# Patient Record
Sex: Female | Born: 2000 | Race: White | Hispanic: No | Marital: Single | Smoking: Current every day smoker
Health system: Southern US, Community
[De-identification: ages and names within clinical notes are randomized; demographics above are authoritative.]

## PROBLEM LIST (undated history)

## (undated) DIAGNOSIS — F32A Depression, unspecified: Secondary | ICD-10-CM

## (undated) DIAGNOSIS — N390 Urinary tract infection, site not specified: Secondary | ICD-10-CM

## (undated) DIAGNOSIS — F909 Attention-deficit hyperactivity disorder, unspecified type: Secondary | ICD-10-CM

## (undated) DIAGNOSIS — F419 Anxiety disorder, unspecified: Secondary | ICD-10-CM

## (undated) DIAGNOSIS — F329 Major depressive disorder, single episode, unspecified: Secondary | ICD-10-CM

## (undated) DIAGNOSIS — N289 Disorder of kidney and ureter, unspecified: Secondary | ICD-10-CM

## (undated) HISTORY — DX: Urinary tract infection, site not specified: N39.0

## (undated) HISTORY — DX: Anxiety disorder, unspecified: F41.9

## (undated) HISTORY — DX: Depression, unspecified: F32.A

## (undated) HISTORY — PX: KIDNEY SURGERY: SHX687

---
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
## 2019-04-10 LAB — CBC AND DIFFERENTIAL
HCT: 40 (ref 36–46)
Hemoglobin: 13 (ref 12.0–16.0)
Platelets: 200 (ref 150–399)
WBC: 5.4

## 2019-04-10 LAB — VITAMIN B12: Vitamin B-12: 254

## 2019-04-10 LAB — BASIC METABOLIC PANEL
BUN: 12 (ref 4–21)
Creatinine: 1.2 — AB (ref 0.5–1.1)
Glucose: 95
Potassium: 3.7 (ref 3.4–5.3)
Sodium: 138 (ref 137–147)

## 2019-04-10 LAB — HEPATIC FUNCTION PANEL
ALT: 8 (ref 7–35)
AST: 18 (ref 13–35)
Alkaline Phosphatase: 41 (ref 25–125)
Bilirubin, Total: 1.4

## 2019-04-10 LAB — VITAMIN D 25 HYDROXY (VIT D DEFICIENCY, FRACTURES): Vit D, 25-Hydroxy: 36.86

## 2019-04-10 LAB — TSH: TSH: 0.73 (ref 0.41–5.90)

## 2019-05-04 ENCOUNTER — Telehealth: Payer: Self-pay | Admitting: General Practice

## 2019-05-04 NOTE — Telephone Encounter (Signed)
Patients stepfather requesting a call back from [facility].

## 2019-05-04 NOTE — Telephone Encounter (Signed)
Called to speak with pt's father. Patient's father was asking about an urgent referral to a psychiatrist due to pts suicidal thoughts and depression. Pt is scheduled for a NP appt with Sam on Tuesday, but I wanted to let the father know that they could take the pt to [facility] and let them know that she is having suicidal thoughts and they will check her in, do some lab work, the ED doctor will talk with her and they will have her change into scrubs and put her belongings away. Then she will speak with a psychiatrist and they will decide to either keep her for behavioral health inpatient or send her home, but based on the description of her symptoms given by her father, they will most likely keep her for inpatient.  Called father to let him know this information, no answer, and left a brief message asking him to call the office back for the information.

## 2019-05-04 NOTE — Telephone Encounter (Signed)
See note

## 2019-05-04 NOTE — Telephone Encounter (Signed)
I wanted to explain a little bit about this appt. I have been communicating with her stepfather, Sam. They are very concerned about her having suicidal thoughts. I did let him know that taking her to [facility] was an option if they werent able to wait until Tuesday for the appt but they did not want to do that. I am happy to fill you in in-person, but basically here are the highlights:  -I scheduled the new patient appt as virtual because I know that is your and Sam's preference, but the patient would rather come into the office if that is OK with you. I also scheduled the appt for 60 minutes due to everything going on with the pt. The patient's stepfather is a patient of Sam's and they specifically requested her, I let them know she is not able to prescribe ADD/ADHD medication.    -The pt and her stepfather are requesting an urgent referral to a female psychiatrist and they really want to be able to see the psychiatrist in person, not through virtual visits, but I told them I wasn't sure if that would be an option.   -I included in the appt notes that the pt possibly has some nutritional issues/hormone imbalance. The pt's stepfather said that she is weighing only 90 lbs but does not think we should mention to the pt that he thinks there may be some nutritional issues.   - There was so much going on with this pt that I wanted to kind of run everything by you. I am happy to discuss all of this in-person when you and Sam are back in the office, but I honestly think it would be best if you could call the stepfather at [phone] (listed as her home phone in her chart) so he can fill you in before her appt on Tuesday.

## 2019-05-10 ENCOUNTER — Ambulatory Visit (INDEPENDENT_AMBULATORY_CARE_PROVIDER_SITE_OTHER): Payer: Medicaid Other | Admitting: Physician Assistant

## 2019-05-10 ENCOUNTER — Encounter: Payer: Self-pay | Admitting: Physician Assistant

## 2019-05-10 VITALS — Ht 62.0 in | Wt 85.0 lb

## 2019-05-10 DIAGNOSIS — R636 Underweight: Secondary | ICD-10-CM

## 2019-05-10 DIAGNOSIS — F419 Anxiety disorder, unspecified: Secondary | ICD-10-CM | POA: Diagnosis not present

## 2019-05-10 DIAGNOSIS — F322 Major depressive disorder, single episode, severe without psychotic features: Secondary | ICD-10-CM | POA: Diagnosis not present

## 2019-05-10 NOTE — Progress Notes (Signed)
Virtual Visit via Video   I connected with Alice Armstrong on 05/10/19 at 11:00 AM EDT by a video enabled telemedicine application and verified that I am speaking with the correct person using two identifiers. Location patient: Home Location provider: [facility], Office Persons participating in the virtual visit: Natsuko D Oliviya, [place] PA-C  I discussed the limitations of evaluation and management by telemedicine and the availability of in person appointments. The patient expressed understanding and agreed to proceed.  I acted as a Education administrator for [employer], [employer], LPN  Subjective:   HPI:   Anxiety & Depression Pt c/o increased anxiety and depression for the past year. Hx of anxiety and depression x 4 years.  Pt stopped OC's cause she thought is was causing depression. Pt is also having panic attacks once a month. She has seen multiple providers in the past, most recently was seeing Primary Care at [facility] and prior to that [employer] at [facility] in [place]. I only have [facility] notes to review at this time. Per cursory chart review she has been on the following medications in the past: Propranolol, Clonidine, Zoloft, Wellbutrin, Ativan. Was seeing Noemi Chapel, psych NP at [facility] -- last seen a few years ago. Mom sees her as well and she would like a new provider.   She also has a diagnosis of ADD. She was diagnosed in middle school, but couldn't tolerate medications due to decreased appetite and feeling "off."  She currently denies suicidal plans. No prior attempts.  Has a boyfriend x 1 year, and trusts him very much. He is aware of her current mental health status and he "knows more than anyone else in my life." She has concerns for bipolar disorder, states that she has never been tested.  Underweight Has had 30 lb of unintentional weight loss x 3 years. Was on birth control pills and was able to maintain weight  around 115 lb. After she stopped the birth control she began to lose weight. Would like to restart. She has not had a period in 1 year.  Dietary recall: Breakfast -- breakfast essentials or cereal Snacks -- almonds or chips Dinner - grilled chicken breast, veggies Snacks -- variable, fruits and chips  Beverages: water, sweet tea  Alcohol: once a month (approximately 1 glass)  Was trying Ensure supplements. Swims twice a week, no excessive exercise. Goes on occasional walks. Denies any fear of weight gain. Wants to get back to 115 lb, feels more comfortable there. Denies purging, bingeing, laxative use.  Gets lightheaded when standing. Gets palpitations on a daily basis. Has a bowel movement every day. Has a 16 oz bottle of water x 2 daily.  Wt Readings from Last 5 Encounters:  05/10/19 85 lb (38.6 kg)  01/11/16 115 lb (52.2 kg) (37 %, Z= -0.34)*  12/10/15 114 lb 6.7 oz (51.9 kg) (36 %, Z= -0.36)*   * Growth percentiles are based on CDC (Girls, 2-20 Years) data.   Body mass index is 15.55 kg/m.    ROS: See pertinent positives and negatives per HPI.  There are no active problems to display for this patient.   Social History   Tobacco Use  . Smoking status: Never Smoker  . Smokeless tobacco: Never Used  Substance Use Topics  . Alcohol use: Yes    Comment: once a month one drink    Current Outpatient Medications:  .  cetirizine (ZYRTEC) 10 MG tablet, Take 10 mg  by mouth daily as needed. , Disp: , Rfl:  .  ibuprofen (ADVIL,MOTRIN) 200 MG tablet, Take 200 mg by mouth every 6 (six) hours as needed. pain, Disp: , Rfl:  .  LORazepam (ATIVAN) 0.5 MG tablet, Take 0.5 mg by mouth as needed. , Disp: , Rfl:   Allergies  Allergen Reactions  . Sulfa Antibiotics Swelling    potential  . Cephalosporins Hives and Swelling  . Latex Other (See Comments)    blisters  . Penicillins Hives and Swelling  . Tape Rash    Objective:   VITALS: Per patient if applicable, see vitals.  GENERAL: Alert, appears well and in no acute distress. HEENT: Atraumatic, conjunctiva clear, no obvious abnormalities on inspection of external nose and ears. NECK: Normal movements of the head and neck. CARDIOPULMONARY: No increased WOB. Speaking in clear sentences. I:E ratio WNL.  MS: Moves all visible extremities without noticeable abnormality. PSYCH: Pleasant and cooperative, well-groomed. Speech normal rate and rhythm. Affect is appropriate. Insight and judgement are appropriate. Attention is focused, linear, and appropriate.  NEURO: CN grossly intact. Oriented as arrived to appointment on time with no prompting. Moves both UE equally.  SKIN: No obvious lesions, wounds, erythema, or cyanosis noted on face or hands.  Assessment and Plan:   Alice Armstrong was seen today for establish care, depression and anxiety.  Diagnoses and all orders for this visit:  Underweight Concern for malnutrition given low BMI and suspect inadequate cal/pro intake. We made her an appointment to come into the office for physical exam, EKG and labs. Will assess severity and determine best course of action at that time (inpatient vs outpatient mgmt.) -     CBC with Differential/Platelet; Future -     Phosphorus; Future -     Magnesium; Future -     Basic metabolic panel; Future -     Hepatic function panel; Future -     TSH; Future -     Lipid panel; Future -     POCT urine pregnancy; Future  Depression, major, single episode, severe (HCC); Anxiety Denies current SI/HI. Has good support system with boyfriend. I discussed with patient that if they develop any SI, to tell someone immediately and seek medical attention. Psych referral placed. -     Ambulatory referral to Psychiatry  . Reviewed expectations re: course of current medical issues. . Discussed self-management of symptoms. . Outlined signs and symptoms indicating need for more acute intervention. . Patient verbalized understanding and all questions  were answered. Marland Kitchen. Health Maintenance issues including appropriate healthy diet, exercise, and smoking avoidance were discussed with patient. . See orders for this visit as documented in the electronic medical record.  I discussed the assessment and treatment plan with the patient. The patient was provided an opportunity to ask questions and all were answered. The patient agreed with the plan and demonstrated an understanding of the instructions.   The patient was advised to call back or seek an in-person evaluation if the symptoms worsen or if the condition fails to improve as anticipated.   CMA or LPN served as scribe during this visit. History, Physical, and Plan performed by medical provider. The above documentation has been reviewed and is accurate and complete.  I spent 45 minutes with this patient, greater than 50% was face-to-face time counseling regarding the above diagnoses.  WoxallSamantha Winfred Redel, [place]PA 05/10/2019

## 2019-05-11 ENCOUNTER — Other Ambulatory Visit: Payer: Self-pay

## 2019-05-11 ENCOUNTER — Encounter: Payer: Self-pay | Admitting: Physician Assistant

## 2019-05-11 ENCOUNTER — Ambulatory Visit (INDEPENDENT_AMBULATORY_CARE_PROVIDER_SITE_OTHER): Payer: Medicaid Other | Admitting: Physician Assistant

## 2019-05-11 VITALS — BP 118/70 | HR 73 | Temp 98.3°F | Ht 62.0 in | Wt 87.0 lb

## 2019-05-11 DIAGNOSIS — R636 Underweight: Secondary | ICD-10-CM | POA: Diagnosis not present

## 2019-05-11 DIAGNOSIS — R5383 Other fatigue: Secondary | ICD-10-CM | POA: Diagnosis not present

## 2019-05-11 LAB — CBC WITH DIFFERENTIAL/PLATELET
Basophils Absolute: 0 10*3/uL (ref 0.0–0.1)
Basophils Relative: 0.6 % (ref 0.0–3.0)
Eosinophils Absolute: 0 10*3/uL (ref 0.0–0.7)
Eosinophils Relative: 0.7 % (ref 0.0–5.0)
HCT: 39 % (ref 36.0–46.0)
Hemoglobin: 13.2 g/dL (ref 12.0–15.0)
Lymphocytes Relative: 40 % (ref 12.0–46.0)
Lymphs Abs: 2.9 10*3/uL (ref 0.7–4.0)
MCHC: 33.8 g/dL (ref 30.0–36.0)
MCV: 92.1 fl (ref 78.0–100.0)
Monocytes Absolute: 0.5 10*3/uL (ref 0.1–1.0)
Monocytes Relative: 7.2 % (ref 3.0–12.0)
Neutro Abs: 3.8 10*3/uL (ref 1.4–7.7)
Neutrophils Relative %: 51.5 % (ref 43.0–77.0)
Platelets: 218 10*3/uL (ref 150.0–400.0)
RBC: 4.24 Mil/uL (ref 3.87–5.11)
RDW: 12.6 % (ref 11.5–14.6)
WBC: 7.4 10*3/uL (ref 4.5–10.5)

## 2019-05-11 LAB — BASIC METABOLIC PANEL
BUN: 14 mg/dL (ref 6–23)
CO2: 27 mEq/L (ref 19–32)
Calcium: 10.4 mg/dL (ref 8.4–10.5)
Chloride: 101 mEq/L (ref 96–112)
Creatinine, Ser: 0.97 mg/dL (ref 0.40–1.20)
GFR: 73.07 mL/min (ref >60.00)
Glucose, Bld: 80 mg/dL (ref 70–99)
Potassium: 4 mEq/L (ref 3.5–5.1)
Sodium: 138 mEq/L (ref 135–145)

## 2019-05-11 LAB — PHOSPHORUS: Phosphorus: 3.9 mg/dL (ref 2.3–4.6)

## 2019-05-11 LAB — HEPATIC FUNCTION PANEL
ALT: 8 U/L (ref 0–35)
AST: 18 U/L (ref 0–37)
Albumin: 5.3 g/dL — ABNORMAL HIGH (ref 3.5–5.2)
Alkaline Phosphatase: 47 U/L (ref 39–117)
Bilirubin, Direct: 0.2 mg/dL (ref 0.0–0.3)
Total Bilirubin: 1 mg/dL (ref 0.2–1.2)
Total Protein: 7.9 g/dL (ref 6.0–8.3)

## 2019-05-11 LAB — LIPID PANEL
Cholesterol: 143 mg/dL (ref 0–200)
HDL: 62.9 mg/dL (ref >39.00)
LDL Cholesterol: 68 mg/dL (ref 0–99)
NonHDL: 79.91
Total CHOL/HDL Ratio: 2
Triglycerides: 61 mg/dL (ref 0.0–149.0)
VLDL: 12.2 mg/dL (ref 0.0–40.0)

## 2019-05-11 LAB — POCT URINE PREGNANCY: Preg Test, Ur: NEGATIVE

## 2019-05-11 LAB — MAGNESIUM: Magnesium: 2.1 mg/dL (ref 1.5–2.5)

## 2019-05-11 LAB — TSH: TSH: 0.76 u[IU]/mL (ref 0.35–5.50)

## 2019-05-11 NOTE — Progress Notes (Signed)
Alice Armstrong is a 18 y.o. female is here to follow up on underweight.  I acted as a Neurosurgeonscribe for Energy East CorporationSamantha Eddis Pingleton, PA-C Corky Mullonna Orphanos, LPN  History of Present Illness:   Chief Complaint  Patient presents with  . underweight    HPI  Patient was seen by me yesterday virtually.  She is here today for physical exam, EKG, and labs.  We talked extensively yesterday about her underweight status, possibly resuming birth control, and her anxiety and depression.  Please see encounter from 05/10/2019 for more information on this.   Health Maintenance Due  Topic Date Due  . HIV Screening  02/27/2014  . CHLAMYDIA SCREENING  12/09/2016  . INFLUENZA VACCINE  04/23/2019    Past Medical History:  Diagnosis Date  . ADHD (attention deficit hyperactivity disorder)   . Anxiety   . Depression   . Migraine   . 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 UTI (urinary tract infection)    can sometimes get chronic UTIs for "months"     Social History   Socioeconomic History  . Marital status: Single    Spouse name: Not on file  . Number of children: Not on file  . Years of education: Not on file  . Highest education level: Not on file  Occupational History  . Occupation: Life guard  Social Needs  . Financial resource strain: Not on file  . Food insecurity    Worry: Not on file    Inability: Not on file  . Transportation needs    Medical: Not on file    Non-medical: Not on file  Tobacco Use  . Smoking status: Never Smoker  . Smokeless tobacco: Never Used  Substance and Sexual Activity  . Alcohol use: Yes    Comment: once a month one drink  . Drug use: Not Currently    Types: Marijuana  . Sexual activity: Yes    Birth control/protection: Condom  Lifestyle  . Physical activity    Days per week: Not on file    Minutes per session: Not on file  . Stress: Not on file  Relationships  . Social Musicianconnections    Talks  on phone: Not on file    Gets together: Not on file    Attends religious service: Not on file    Active member of club or organization: Not on file    Attends meetings of clubs or organizations: Not on file    Relationship status: Not on file  . Intimate partner violence    Fear of current or ex partner: Not on file    Emotionally abused: Not on file    Physically abused: Not on file    Forced sexual activity: Not on file  Other Topics Concern  . 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 KIDNEY SURGERY  at 1043 weeks old  . KIDNEY SURGERY      Family History  Problem Relation Age of Onset  . Hypertension Mother   . Anxiety disorder Mother   . Depression Mother   . Thyroid disease Mother   . Hypertension Father   . Heart attack Father   . Colon cancer  Neg Hx     PMHx, SurgHx, SocialHx, FamHx, Medications, and Allergies were reviewed in the Visit Navigator and updated as appropriate.   Patient Active Problem List   Diagnosis Date Noted  . Mild single current episode of major depressive disorder (HCC) 11/06/2016  . Attention deficit disorder 11/06/2016  . Anxiety, generalized 11/06/2016  . Irregular periods 07/31/2015    Social History   Tobacco Use  . Smoking status: Never Smoker  . Smokeless tobacco: Never Used  Substance Use Topics  . Alcohol use: Yes    Comment: once a month one drink  . Drug use: Not Currently    Types: Marijuana    Current Medications and Allergies:    Current Outpatient Medications:  .  cetirizine (ZYRTEC) 10 MG tablet, Take 10 mg by mouth daily as needed. , Disp: , Rfl:  .  ibuprofen (ADVIL,MOTRIN) 200 MG tablet, Take 200 mg by mouth every 6 (six) hours as needed. pain, Disp: , Rfl:  .  LORazepam (ATIVAN) 0.5 MG tablet, Take 0.5 mg by mouth as needed. , Disp: ,  Rfl:    Allergies  Allergen Reactions  . Sulfa Antibiotics Swelling    potential  . Cephalosporins Hives and Swelling  . Latex Other (See Comments)    blisters  . Penicillins Hives and Swelling  . Tape Rash    Review of Systems   ROS  Negative unless otherwise specified per HPI.  Vitals:   Vitals:   05/11/19 1339  BP: 118/70  Pulse: 73  Temp: 98.3 F (36.8 C)  TempSrc: Temporal  SpO2: 99%  Weight: 87 lb (39.5 kg)  Height: 5\' 2"  (1.575 m)     Body mass index is 15.91 kg/m.   Physical Exam:    Physical Exam Vitals signs and nursing note reviewed.  Constitutional:      General: She is not in acute distress.    Appearance: She is well-developed. She is not ill-appearing or toxic-appearing.  HENT:     Mouth/Throat:     Lips: Pink.     Mouth: Mucous membranes are moist.     Tongue: No lesions.     Pharynx: Oropharynx is clear.     Comments: Moist mucous membranes Cardiovascular:     Rate and Rhythm: Normal rate and regular rhythm.     Pulses: Normal pulses.     Heart sounds: Normal heart sounds, S1 normal and S2 normal.     Comments: No LE edema Pulmonary:     Effort: Pulmonary effort is normal.     Breath sounds: Normal breath sounds.  Skin:    General: Skin is warm and dry.  Neurological:     Mental Status: She is alert.     GCS: GCS eye subscore is 4. GCS verbal subscore is 5. GCS motor subscore is 6.  Psychiatric:        Attention and Perception: Attention normal.        Mood and Affect: Mood normal.        Speech: Speech normal.        Behavior: Behavior normal. Behavior is cooperative.       Assessment and Plan:    Madeleine was seen today for underweight.  Diagnoses and all orders for this visit:  Underweight -     EKG 12-Lead -     POCT urine pregnancy -     Lipid panel -     TSH -     Hepatic function panel -  Basic metabolic panel -     Magnesium -     Phosphorus -     CBC with Differential/Platelet   EKG tracing is  personally reviewed.  EKG notes NSR.  No acute changes.  Vitals are great, no hypotension and no bradycardia.  We are going to check labs to assess level of potential nutritional deficiency, as well as check a urine pregnancy. Will consider resuming birth control and adding plan for calories/protein intake after labs return. Psych consult pending. I discussed with patient that if they develop any SI, to tell someone immediately and seek medical attention.   . Reviewed expectations re: course of current medical issues. . Discussed self-management of symptoms. . Outlined signs and symptoms indicating need for more acute intervention. . Patient verbalized understanding and all questions were answered. . See orders for this visit as documented in the electronic medical record. . Patient received an After Visit Summary.  CMA or LPN served as scribe during this visit. History, Physical, and Plan performed by medical provider. The above documentation has been reviewed and is accurate and complete.  Inda Coke, PA-C [place] 05/11/2019  Follow-up: No follow-ups on file.

## 2019-05-12 ENCOUNTER — Other Ambulatory Visit: Payer: Self-pay | Admitting: Physician Assistant

## 2019-05-12 MED ORDER — NORGESTIM-ETH ESTRAD TRIPHASIC 0.18/0.215/0.25 MG-25 MCG PO TABS: 1.0000 | ORAL_TABLET | Freq: Every day | ORAL | 11 refills | Status: DC

## 2019-05-13 ENCOUNTER — Telehealth: Payer: Self-pay

## 2019-05-13 NOTE — Telephone Encounter (Signed)
See result notes. 

## 2019-05-13 NOTE — Telephone Encounter (Signed)
Copied from [place] [id number]. Topic: General - Inquiry >> May 13, 2019  2:29 PM Rayann Heman wrote: Reason for CRM: pt called and stated that she missed a call from [place] and would like a call back. Please advise

## 2019-05-16 ENCOUNTER — Ambulatory Visit: Payer: BLUE CROSS/BLUE SHIELD | Admitting: Family Medicine

## 2019-05-18 ENCOUNTER — Encounter: Payer: Self-pay | Admitting: Physician Assistant

## 2019-05-18 ENCOUNTER — Ambulatory Visit: Payer: Medicaid Other | Admitting: Physician Assistant

## 2019-05-18 ENCOUNTER — Ambulatory Visit (INDEPENDENT_AMBULATORY_CARE_PROVIDER_SITE_OTHER): Payer: Medicaid Other | Admitting: Physician Assistant

## 2019-05-18 ENCOUNTER — Other Ambulatory Visit: Payer: Self-pay

## 2019-05-18 VITALS — BP 106/60 | HR 80 | Temp 99.1°F | Ht 62.0 in | Wt 87.4 lb

## 2019-05-18 DIAGNOSIS — R636 Underweight: Secondary | ICD-10-CM

## 2019-05-18 NOTE — Progress Notes (Signed)
Alice Armstrong is a 18 y.o. female is here to follow up on weight.  I acted as a Neurosurgeon for [employer], PA-C Alice Mull, LPN  History of Present Illness:   Chief Complaint  Patient presents with  . underweight    HPI   Underweight. Pt following up today on her weight and discuss nutrition.   Dietary recall: Wakes up at 10-11a Breakfast -- 11:30a -- breakfast essentials with whole milk, fruit sometimes Snack -- 1p -- handful of almonds, chips Dinner -- 7p --2 ounces chicken, veggies or 1 cup of pasta with sauce, usually has wheat Snacks -- 8p -- [employer] bar Snacks -- 11p --popcorn or almonds Goes to bed around 2a  Beverages --drinks about 2 cups of sweet tea a day  Take out:  --[employer] will get hamburger, small fry, large sweet tea --[employer]ushi will get one roll  Weight: Wt Readings from Last 3 Encounters:  05/18/19 87 lb 6.1 oz (39.6 kg)  05/11/19 87 lb (39.5 kg)  05/10/19 85 lb (38.6 kg)   Exercise -- minimal, swims weekly   Estimated daily energy needs: Calories: 2000 - 2200 kcal Protein: 70-85 g Fluid: at least 1.8 liters  Wt Readings from Last 3 Encounters:  05/18/19 87 lb 6.1 oz (39.6 kg)  05/11/19 87 lb (39.5 kg)  05/10/19 85 lb (38.6 kg)     Health Maintenance Due  Topic Date Due  . HIV Screening  02/27/2014  . CHLAMYDIA SCREENING  12/09/2016  . INFLUENZA VACCINE  04/23/2019    Past Medical History:  Diagnosis Date  . ADHD (attention deficit hyperactivity disorder)   . Anxiety   . Depression   . Migraine   . 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 UTI (urinary tract infection)    can sometimes get chronic UTIs for "months"     Social History   Socioeconomic History  . Marital status: Single    Spouse name: Not on file  . Number of children: Not on file  . Years of education: Not on file  . Highest education level: Not on file  Occupational History  .  Occupation: Life guard  Social Needs  . Financial resource strain: Not on file  . Food insecurity    Worry: Not on file    Inability: Not on file  . Transportation needs    Medical: Not on file    Non-medical: Not on file  Tobacco Use  . Smoking status: Never Smoker  . Smokeless tobacco: Never Used  Substance and Sexual Activity  . Alcohol use: Yes    Comment: once a month one drink  . Drug use: Not Currently    Types: Marijuana  . Sexual activity: Yes    Birth control/protection: Condom  Lifestyle  . Physical activity    Days per week: Not on file    Minutes per session: Not on file  . Stress: Not on file  Relationships  . Social Musician on phone: Not on file    Gets together: Not on file    Attends religious service: Not on file    Active member of club or organization: Not on file    Attends meetings of clubs or organizations: Not on file    Relationship status: Not on file  . Intimate partner violence    Fear of current or ex partner: Not on file  Emotionally abused: Not on file    Physically abused: Not on file    Forced sexual activity: Not on file  Other Topics Concern  . Not on file  Social History Narrative   Alice Armstrong; loves to swim and pain   Lives with mom and stepfather   Has a half-sister that doesn't live with her    Past Surgical History:  Procedure Laterality Date  . KIDNEY SURGERY  at 80 weeks old  . KIDNEY SURGERY      Family History  Problem Relation Age of Onset  . Hypertension Mother   . Anxiety disorder Mother   . Depression Mother   . Thyroid disease Mother   . Hypertension Father   . Heart attack Father   . Colon cancer Neg Hx     PMHx, SurgHx, SocialHx, FamHx, Medications, and Allergies were reviewed in the Visit Navigator and updated as appropriate.   Patient Active Problem List   Diagnosis Date Noted  . Mild single current episode of major depressive disorder ([place]) 11/06/2016  . Attention deficit  disorder 11/06/2016  . Anxiety, generalized 11/06/2016  . Irregular periods 07/31/2015    Social History   Tobacco Use  . Smoking status: Never Smoker  . Smokeless tobacco: Never Used  Substance Use Topics  . Alcohol use: Yes    Comment: once a month one drink  . Drug use: Not Currently    Types: Marijuana    Current Medications and Allergies:    Current Outpatient Medications:  .  ibuprofen (ADVIL,MOTRIN) 200 MG tablet, Take 200 mg by mouth every 6 (six) hours as needed. pain, Disp: , Rfl:  .  LORazepam (ATIVAN) 0.5 MG tablet, Take 0.5 mg by mouth as needed. , Disp: , Rfl:  .  Norgestimate-Ethinyl Estradiol Triphasic 0.18/0.215/0.25 MG-25 MCG tab, Take 1 tablet by mouth daily., Disp: 1 Package, Rfl: 11   Allergies  Allergen Reactions  . Sulfa Antibiotics Swelling    potential  . Cephalosporins Hives and Swelling  . Latex Other (See Comments)    blisters  . Penicillins Hives and Swelling  . Tape Rash    Review of Systems   ROS Negative unless otherwise specified per HPI.  Vitals:   Vitals:   05/18/19 1425  BP: 106/60  Pulse: 80  Temp: 99.1 F (37.3 C)  TempSrc: Temporal  SpO2: 99%  Weight: 87 lb 6.1 oz (39.6 kg)  Height: 5\' 2"  (1.575 m)     Body mass index is 15.98 kg/m.   Physical Exam:   Physical Exam Constitutional:      Appearance: She is well-developed.  HENT:     Head: Normocephalic and atraumatic.  Eyes:     Conjunctiva/sclera: Conjunctivae normal.  Neck:     Musculoskeletal: Normal range of motion and neck supple.  Pulmonary:     Effort: Pulmonary effort is normal.  Musculoskeletal: Normal range of motion.  Skin:    General: Skin is warm and dry.  Neurological:     Mental Status: She is alert and oriented to person, place, and time.  Psychiatric:        Behavior: Behavior normal.        Thought Content: Thought content normal.        Judgment: Judgment normal.       Assessment and Plan:    Shavonn was seen today for  underweight.  Diagnoses and all orders for this visit:  Underweight   Discussed specific, individualized recommendations regarding nutrition including  focusing on high protein portions, eating every 2-3 hours and staying hydrated. Handouts provided included: Balanced Plate, Balanced Snack List, Balanced Breakfast. Provided emotional support and encouraged slow, steady weight gain. Patient's questions answered throughout encounter. Follow-up with me in two weeks. Specific advice given in the AVS.   . Reviewed expectations re: course of current medical issues. . Discussed self-management of symptoms. . Outlined signs and symptoms indicating need for more acute intervention. . Patient verbalized understanding and all questions were answered. . See orders for this visit as documented in the electronic medical record. . Patient received an After Visit Summary.  CMA or LPN served as scribe during this visit. History, Physical, and Plan performed by medical provider. The above documentation has been reviewed and is accurate and complete.  I spent 25 minutes with this patient, greater than 50% was face-to-face time counseling regarding the above diagnoses.   Jarold MottoSamantha Tuwana Kapaun, PA-C [place] 05/18/2019  Follow-up: Return in about 2 weeks (around 06/01/2019) for nutrition f/u.

## 2019-05-18 NOTE — Patient Instructions (Signed)
It was great to see you!  1. Eat every 3-4 hours while you are awake. 2. Always have a protein, and then a good source of carbohydrate too if you're up for it. 3. Great shakes include: Premier Protein, Ensure, Boost, etc. Or make your own. 4. Try to look for some protein bars that have >6 grams of protein in them that you enjoy  Let's follow-up in 2 weeks, sooner if you have concerns.  Take care,  Inda Coke PA-C

## 2019-06-01 ENCOUNTER — Ambulatory Visit: Payer: Medicaid Other | Admitting: Physician Assistant

## 2019-06-01 NOTE — Progress Notes (Deleted)
Alice Armstrong is a 18 y.o. female is here to follow up on nutrition and weight.  I acted as a Education administrator for [employer], PA-C [employer], LPN  History of Present Illness:   No chief complaint on file.   HPI  Underweight Pt following up today on weight.  Health Maintenance Due  Topic Date Due  . HIV Screening  02/27/2014  . CHLAMYDIA SCREENING  12/09/2016  . INFLUENZA VACCINE  04/23/2019    Past Medical History:  Diagnosis Date  . ADHD (attention deficit hyperactivity disorder)   . Anxiety   . Depression   . Migraine   . 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 UTI (urinary tract infection)    can sometimes get chronic UTIs for "months"     Social History   Socioeconomic History  . Marital status: Single    Spouse name: Not on file  . Number of children: Not on file  . Years of education: Not on file  . Highest education level: Not on file  Occupational History  . Occupation: Life guard  Social Needs  . Financial resource strain: Not on file  . Food insecurity    Worry: Not on file    Inability: Not on file  . Transportation needs    Medical: Not on file    Non-medical: Not on file  Tobacco Use  . Smoking status: Never Smoker  . Smokeless tobacco: Never Used  Substance and Sexual Activity  . Alcohol use: Yes    Comment: once a month one drink  . Drug use: Not Currently    Types: Marijuana  . Sexual activity: Yes    Birth control/protection: Condom  Lifestyle  . Physical activity    Days per week: Not on file    Minutes per session: Not on file  . Stress: Not on file  Relationships  . Social Herbalist on phone: Not on file    Gets together: Not on file    Attends religious service: Not on file    Active member of club or organization: Not on file    Attends meetings of clubs or organizations: Not on file    Relationship status: Not on file  . Intimate partner  violence    Fear of current or ex partner: Not on file    Emotionally abused: Not on file    Physically abused: Not on file    Forced sexual activity: Not on file  Other Topics Concern  . 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 KIDNEY SURGERY  at 41 weeks old  . KIDNEY SURGERY      Family History  Problem Relation Age of Onset  . Hypertension Mother   . Anxiety disorder Mother   . Depression Mother   . Thyroid disease Mother   . Hypertension Father   . Heart attack Father   . Colon cancer Neg Hx     PMHx, SurgHx, SocialHx, FamHx, Medications, and Allergies were reviewed in the Visit Navigator and updated as appropriate.   Patient Active Problem List   Diagnosis Date Noted  . Mild single current episode of major depressive disorder (  HCC) 11/06/2016  . Attention deficit disorder 11/06/2016  . Anxiety, generalized 11/06/2016  . Irregular periods 07/31/2015    Social History   Tobacco Use  . Smoking status: Never Smoker  . Smokeless tobacco: Never Used  Substance Use Topics  . Alcohol use: Yes    Comment: once a month one drink  . Drug use: Not Currently    Types: Marijuana    Current Medications and Allergies:    Current Outpatient Medications:  .  ibuprofen (ADVIL,MOTRIN) 200 MG tablet, Take 200 mg by mouth every 6 (six) hours as needed. pain, Disp: , Rfl:  .  LORazepam (ATIVAN) 0.5 MG tablet, Take 0.5 mg by mouth as needed. , Disp: , Rfl:  .  Norgestimate-Ethinyl Estradiol Triphasic 0.18/0.215/0.25 MG-25 MCG tab, Take 1 tablet by mouth daily., Disp: 1 Package, Rfl: 11  Allergies  Allergen Reactions  . Sulfa Antibiotics Swelling    potential  . Cephalosporins Hives and Swelling  . Latex Other (See Comments)    blisters  . Penicillins Hives and Swelling  . Tape Rash    Review  of Systems   ROS  Vitals:  There were no vitals filed for this visit.   There is no height or weight on file to calculate BMI.   Physical Exam:    Physical Exam   Assessment and Plan:    There are no diagnoses linked to this encounter.  . Reviewed expectations re: course of current medical issues. . Discussed self-management of symptoms. . Outlined signs and symptoms indicating need for more acute intervention. . Patient verbalized understanding and all questions were answered. . See orders for this visit as documented in the electronic medical record. . Patient received an After Visit Summary.  ***  Jarold MottoSamantha Worley, PA-C [place] 06/01/2019  Follow-up: No follow-ups on file.

## 2019-06-08 ENCOUNTER — Encounter: Payer: Self-pay | Admitting: Physician Assistant

## 2019-06-08 ENCOUNTER — Other Ambulatory Visit: Payer: Self-pay

## 2019-06-08 ENCOUNTER — Ambulatory Visit (INDEPENDENT_AMBULATORY_CARE_PROVIDER_SITE_OTHER): Payer: Medicaid Other | Admitting: Physician Assistant

## 2019-06-08 VITALS — BP 120/72 | HR 72 | Temp 98.2°F | Ht 62.0 in | Wt 87.2 lb

## 2019-06-08 DIAGNOSIS — K5900 Constipation, unspecified: Secondary | ICD-10-CM | POA: Diagnosis not present

## 2019-06-08 DIAGNOSIS — R636 Underweight: Secondary | ICD-10-CM

## 2019-06-08 NOTE — Progress Notes (Signed)
Alice Armstrong is a 18 y.o. female is here to follow up on weight  I acted as a Education administrator for [employer], PA-C Anselmo Pickler, LPN  History of Present Illness:   Chief Complaint  Patient presents with  . Underweight    HPI   Underweight Pt following up today on weight. Last visit weight was 87 lbs 6.1 oz. Pt is trying to keep a food dairy. She is eating 2 meals a day.  She reports that when she started her birth control recently she had nausea initially but this resolved.  After the nausea resolved she did feel like it was helping her appetite.  However she recently went to the beach, and forgot her birth control pills, so she has been without them for a bit.  She has been able to drink boost, and increased protein with her snacks however she continues to have very limited appetite.  She is unable to get in with psychiatry within 1 to 2 months, and therefore has not scheduled.  She does feel like she may have some diagnosed bipolar disorder.  Her boyfriend is with her today.  She denies any suicidal ideation.  She continues to have ongoing constipation.  She has never abused laxatives.  The only thing that she is used in the past is a suppository.  Wt Readings from Last 5 Encounters:  06/08/19 87 lb 4 oz (39.6 kg)  05/18/19 87 lb 6.1 oz (39.6 kg)  05/11/19 87 lb (39.5 kg)  05/10/19 85 lb (38.6 kg)  01/11/16 115 lb (52.2 kg) (37 %, Z= -0.34)*   * Growth percentiles are based on CDC (Girls, 2-20 Years) data.   Body mass index is 15.96 kg/m.   Health Maintenance Due  Topic Date Due  . HIV Screening  02/27/2014  . CHLAMYDIA SCREENING  12/09/2016    Past Medical History:  Diagnosis Date  . ADHD (attention deficit hyperactivity disorder)   . Anxiety   . Depression   . Migraine   . 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 UTI (urinary tract infection)    can sometimes get chronic UTIs for "months"      Social History   Socioeconomic History  . Marital status: Single    Spouse name: Not on file  . Number of children: Not on file  . Years of education: Not on file  . Highest education level: Not on file  Occupational History  . Occupation: Life guard  Social Needs  . Financial resource strain: Not on file  . Food insecurity    Worry: Not on file    Inability: Not on file  . Transportation needs    Medical: Not on file    Non-medical: Not on file  Tobacco Use  . Smoking status: Never Smoker  . Smokeless tobacco: Never Used  Substance and Sexual Activity  . Alcohol use: Yes    Comment: once a month one drink  . Drug use: Not Currently    Types: Marijuana  . Sexual activity: Yes    Birth control/protection: Condom  Lifestyle  . Physical activity    Days per week: Not on file    Minutes per session: Not on file  . Stress: Not on file  Relationships  . Social Herbalist on phone: Not on file    Gets together: Not on file    Attends  religious service: Not on file    Active member of club or organization: Not on file    Attends meetings of clubs or organizations: Not on file    Relationship status: Not on file  . Intimate partner violence    Fear of current or ex partner: Not on file    Emotionally abused: Not on file    Physically abused: Not on file    Forced sexual activity: Not on file  Other Topics Concern  . 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 KIDNEY SURGERY  at 51 weeks old  . KIDNEY SURGERY      Family History  Problem Relation Age of Onset  . Hypertension Mother   . Anxiety disorder Mother   . Depression Mother   . Thyroid disease Mother   . Hypertension Father   . Heart attack Father   . Colon cancer Neg Hx     PMHx, SurgHx, SocialHx, FamHx, Medications,  and Allergies were reviewed in the Visit Navigator and updated as appropriate.   Patient Active Problem List   Diagnosis Date Noted  . Mild single current episode of major depressive disorder (HCC) 11/06/2016  . Attention deficit disorder 11/06/2016  . Anxiety, generalized 11/06/2016  . Irregular periods 07/31/2015    Social History   Tobacco Use  . Smoking status: Never Smoker  . Smokeless tobacco: Never Used  Substance Use Topics  . Alcohol use: Yes    Comment: once a month one drink  . Drug use: Not Currently    Types: Marijuana    Current Medications and Allergies:    Current Outpatient Medications:  .  ibuprofen (ADVIL,MOTRIN) 200 MG tablet, Take 200 mg by mouth every 6 (six) hours as needed. pain, Disp: , Rfl:  .  LORazepam (ATIVAN) 0.5 MG tablet, Take 0.5 mg by mouth as needed. , Disp: , Rfl:  .  Norgestimate-Ethinyl Estradiol Triphasic 0.18/0.215/0.25 MG-25 MCG tab, Take 1 tablet by mouth daily., Disp: 1 Package, Rfl: 11   Allergies  Allergen Reactions  . Sulfa Antibiotics Swelling    potential  . Cephalosporins Hives and Swelling  . Latex Other (See Comments)    blisters  . Penicillins Hives and Swelling  . Tape Rash    Review of Systems   ROS Negative unless otherwise specified per HPI.  Vitals:   Vitals:   06/08/19 1302  BP: 120/72  Pulse: 72  Temp: 98.2 F (36.8 C)  TempSrc: Temporal  SpO2: 98%  Weight: 87 lb 4 oz (39.6 kg)  Height: 5\' 2"  (1.575 m)     Body mass index is 15.96 kg/m.   Physical Exam:    Physical Exam Constitutional:      Appearance: She is well-developed.  HENT:     Head: Normocephalic and atraumatic.  Eyes:     Conjunctiva/sclera: Conjunctivae normal.  Neck:     Musculoskeletal: Normal range of motion and neck supple.  Pulmonary:     Effort: Pulmonary effort is normal.  Musculoskeletal: Normal range of motion.  Skin:    General: Skin is warm and dry.  Neurological:     Mental Status: She is alert and  oriented to person, place, and time.  Psychiatric:        Behavior: Behavior normal.  Thought Content: Thought content normal.        Judgment: Judgment normal.      Assessment and Plan:    Renae was seen today for underweight.  Diagnoses and all orders for this visit:  Constipation, unspecified constipation type I did recommend occasional use of MiraLAX.  I did discuss with her and her boyfriend that I do not want her using this regularly, because I do not want her to.  Patient verbalized understanding of plan.  Underweight She feels as though her birth control will help her, she understands importance of taking it regularly and is planning to resume this today.  Again reiterated eating small frequent meals, always having a good source of protein, potentially drinking high-protein smoothies or eating protein bars.  I encouraged her and discussed with her the importance of making an appointment with a psychiatrist, regardless of how far out it needs to be. I discussed with patient that if they develop any SI, to tell someone immediately and seek medical attention.  Follow-up in 2-4 weeks, sooner if needed.  . Reviewed expectations re: course of current medical issues. . Discussed self-management of symptoms. . Outlined signs and symptoms indicating need for more acute intervention. . Patient verbalized understanding and all questions were answered. . See orders for this visit as documented in the electronic medical record. . Patient received an After Visit Summary.  CMA or LPN served as scribe during this visit. History, Physical, and Plan performed by medical provider. The above documentation has been reviewed and is accurate and complete.   Jarold MottoSamantha Akeyla Molden, PA-C [place] 06/08/2019  Follow-up: No follow-ups on file.

## 2019-06-08 NOTE — Patient Instructions (Signed)
It was great to see you!  Please continue taking birth control daily, to see if this will decrease appetite. Continue to work on the strategies we discussed at last visit to increase appetite. Please call and schedule appointment with a psychiatrist, regardless of how far out they are booking.  This is important.  The medication that we discussed for as needed gentle constipation assistance is MiraLAX.  This is a powder that you mix in the beverage of your choice, it is gentle and you can use as needed to soften stools.  The medication that we discussed for sleep/depression, can actually induce mania, so unfortunately I cannot recommend that after all. BUT, even more reason to call psychiatry to get them to help [place] out.  Let's follow-up in 2-4 weeks, sooner if you have concerns.  Take care,  Inda Coke PA-C

## 2019-06-22 ENCOUNTER — Ambulatory Visit: Payer: Medicaid Other | Admitting: Physician Assistant

## 2019-06-22 NOTE — Progress Notes (Deleted)
Alice Armstrong is a 18 y.o. female is here to follow up on weight.  I acted as a Neurosurgeon for [employer], PA-C [employer], LPN  History of Present Illness:   No chief complaint on file.   HPI  Underweight Pt following up to day on weight. Last visit 9/16 she weighed 87 lbs 4 oz, she has   Health Maintenance Due  Topic Date Due  . HIV Screening  02/27/2014  . CHLAMYDIA SCREENING  12/09/2016    Past Medical History:  Diagnosis Date  . ADHD (attention deficit hyperactivity disorder)   . Anxiety   . Depression   . Migraine   . 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 UTI (urinary tract infection)    can sometimes get chronic UTIs for "months"     Social History   Socioeconomic History  . Marital status: Single    Spouse name: Not on file  . Number of children: Not on file  . Years of education: Not on file  . Highest education level: Not on file  Occupational History  . Occupation: Life guard  Social Needs  . Financial resource strain: Not on file  . Food insecurity    Worry: Not on file    Inability: Not on file  . Transportation needs    Medical: Not on file    Non-medical: Not on file  Tobacco Use  . Smoking status: Never Smoker  . Smokeless tobacco: Never Used  Substance and Sexual Activity  . Alcohol use: Yes    Comment: once a month one drink  . Drug use: Not Currently    Types: Marijuana  . Sexual activity: Yes    Birth control/protection: Condom  Lifestyle  . Physical activity    Days per week: Not on file    Minutes per session: Not on file  . Stress: Not on file  Relationships  . Social Musician on phone: Not on file    Gets together: Not on file    Attends religious service: Not on file    Active member of club or organization: Not on file    Attends meetings of clubs or organizations: Not on file    Relationship status: Not on file  . Intimate  partner violence    Fear of current or ex partner: Not on file    Emotionally abused: Not on file    Physically abused: Not on file    Forced sexual activity: Not on file  Other Topics Concern  . 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 KIDNEY SURGERY  at 57 weeks old  . KIDNEY SURGERY      Family History  Problem Relation Age of Onset  . Hypertension Mother   . Anxiety disorder Mother   . Depression Mother   . Thyroid disease Mother   . Hypertension Father   . Heart attack Father   . Colon cancer Neg Hx     PMHx, SurgHx, SocialHx, FamHx, Medications, and Allergies were reviewed in the Visit Navigator and updated as appropriate.   Patient Active Problem List   Diagnosis Date Noted  . Mild single current  episode of major depressive disorder ([place]) 11/06/2016  . Attention deficit disorder 11/06/2016  . Anxiety, generalized 11/06/2016  . Irregular periods 07/31/2015    Social History   Tobacco Use  . Smoking status: Never Smoker  . Smokeless tobacco: Never Used  Substance Use Topics  . Alcohol use: Yes    Comment: once a month one drink  . Drug use: Not Currently    Types: Marijuana    Current Medications and Allergies:    Current Outpatient Medications:  .  ibuprofen (ADVIL,MOTRIN) 200 MG tablet, Take 200 mg by mouth every 6 (six) hours as needed. pain, Disp: , Rfl:  .  LORazepam (ATIVAN) 0.5 MG tablet, Take 0.5 mg by mouth as needed. , Disp: , Rfl:  .  Norgestimate-Ethinyl Estradiol Triphasic 0.18/0.215/0.25 MG-25 MCG tab, Take 1 tablet by mouth daily., Disp: 1 Package, Rfl: 11  Allergies  Allergen Reactions  . Sulfa Antibiotics Swelling    potential  . Cephalosporins Hives and Swelling  . Latex Other (See Comments)    blisters  . Penicillins Hives and Swelling  . Tape Rash     Review of Systems   ROS  Vitals:  There were no vitals filed for this visit.   There is no height or weight on file to calculate BMI.   Physical Exam:    Physical Exam   Assessment and Plan:    There are no diagnoses linked to this encounter.  . Reviewed expectations re: course of current medical issues. . Discussed self-management of symptoms. . Outlined signs and symptoms indicating need for more acute intervention. . Patient verbalized understanding and all questions were answered. . See orders for this visit as documented in the electronic medical record. . Patient received an After Visit Summary.  ***  Inda Coke, PA-C [place] 06/22/2019  Follow-up: No follow-ups on file.

## 2019-06-26 ENCOUNTER — Emergency Department (HOSPITAL_COMMUNITY)
Admission: EM | Admit: 2019-06-26 | Discharge: 2019-06-27 | Disposition: A | Discharge status: Home / Self Care | Payer: Medicaid Other | Attending: Emergency Medicine | Admitting: Emergency Medicine

## 2019-06-26 ENCOUNTER — Encounter (HOSPITAL_COMMUNITY): Payer: Self-pay | Admitting: *Deleted

## 2019-06-26 ENCOUNTER — Other Ambulatory Visit: Payer: Self-pay

## 2019-06-26 DIAGNOSIS — F329 Major depressive disorder, single episode, unspecified: Secondary | ICD-10-CM | POA: Diagnosis not present

## 2019-06-26 DIAGNOSIS — Z91040 Latex allergy status: Secondary | ICD-10-CM | POA: Insufficient documentation

## 2019-06-26 DIAGNOSIS — Z793 Long term (current) use of hormonal contraceptives: Secondary | ICD-10-CM | POA: Insufficient documentation

## 2019-06-26 DIAGNOSIS — F909 Attention-deficit hyperactivity disorder, unspecified type: Secondary | ICD-10-CM | POA: Diagnosis not present

## 2019-06-26 DIAGNOSIS — F1290 Cannabis use, unspecified, uncomplicated: Secondary | ICD-10-CM | POA: Insufficient documentation

## 2019-06-26 DIAGNOSIS — F32A Depression, unspecified: Secondary | ICD-10-CM

## 2019-06-26 LAB — COMPREHENSIVE METABOLIC PANEL
ALT: 10 U/L (ref 0–44)
AST: 25 U/L (ref 15–41)
Albumin: 4.9 g/dL (ref 3.5–5.0)
Alkaline Phosphatase: 49 U/L (ref 38–126)
Anion gap: 11 (ref 5–15)
BUN: 11 mg/dL (ref 6–20)
CO2: 25 mmol/L (ref 22–32)
Calcium: 9.3 mg/dL (ref 8.9–10.3)
Chloride: 102 mmol/L (ref 98–111)
Creatinine, Ser: 1.15 mg/dL — ABNORMAL HIGH (ref 0.44–1.00)
GFR calc Af Amer: >60 mL/min (ref >60)
GFR calc non Af Amer: >60 mL/min (ref >60)
Glucose, Bld: 115 mg/dL — ABNORMAL HIGH (ref 70–99)
Potassium: 3.1 mmol/L — ABNORMAL LOW (ref 3.5–5.1)
Sodium: 138 mmol/L (ref 135–145)
Total Bilirubin: 1.4 mg/dL — ABNORMAL HIGH (ref 0.3–1.2)
Total Protein: 7.5 g/dL (ref 6.5–8.1)

## 2019-06-26 LAB — CBC
HCT: 38.1 % (ref 36.0–46.0)
Hemoglobin: 13.3 g/dL (ref 12.0–15.0)
MCH: 31.7 pg (ref 26.0–34.0)
MCHC: 34.9 g/dL (ref 30.0–36.0)
MCV: 90.9 fL (ref 80.0–100.0)
Platelets: 187 10*3/uL (ref 150–400)
RBC: 4.19 MIL/uL (ref 3.87–5.11)
RDW: 11.9 % (ref 11.5–15.5)
WBC: 9.1 10*3/uL (ref 4.0–10.5)
nRBC: 0 % (ref 0.0–0.2)

## 2019-06-26 LAB — I-STAT BETA HCG BLOOD, ED (MC, WL, AP ONLY): I-stat hCG, quantitative: <5 m[IU]/mL (ref <5)

## 2019-06-26 NOTE — ED Triage Notes (Signed)
Pt says for the past couple of weeks that her "depression has been really bad". Not taking any medication for the same. Pt says "I want to be here, but there are definitely thoughts that I don't" However, denies any SI.

## 2019-06-27 ENCOUNTER — Telehealth: Payer: Self-pay | Admitting: *Deleted

## 2019-06-27 LAB — SALICYLATE LEVEL: Salicylate Lvl: <7 mg/dL (ref 2.8–30.0)

## 2019-06-27 LAB — ETHANOL: Alcohol, Ethyl (B): <10 mg/dL (ref <10)

## 2019-06-27 LAB — ACETAMINOPHEN LEVEL: Acetaminophen (Tylenol), Serum: <10 ug/mL — ABNORMAL LOW (ref 10–30)

## 2019-06-27 MED ORDER — POTASSIUM CHLORIDE CRYS ER 20 MEQ PO TBCR
40.0000 meq | EXTENDED_RELEASE_TABLET | Freq: Once | ORAL | Status: AC
  Administered 2019-06-27: 40 meq via ORAL
  Filled 2019-06-27: qty 2

## 2019-06-27 NOTE — ED Notes (Signed)
PT DOING TTS NOW.

## 2019-06-27 NOTE — Progress Notes (Signed)
TTS attempted to contact the ED in order to set up telepsych cart but no answer. Will try again.  Lind Covert, MSW, LCSW Therapeutic Triage Specialist  [phone]
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
## 2019-06-27 NOTE — ED Notes (Signed)
TTS bedside 

## 2019-06-27 NOTE — ED Provider Notes (Signed)
[facility] EMERGENCY DEPARTMENT Provider Note   CSN: [id number] Arrival date & time: 06/26/19  2218     History   Chief Complaint Chief Complaint  Patient presents with  . Depression    HPI Alice Armstrong is a 18 y.o. female.     Patient presents to the emergency department with a chief complaint of depression.  She states that she has been very depressed recently.  She also reports erratic mood swings.  She denies taking anything for symptoms.  She denies any SI or HI.  Denies any hallucinations.  She does use marijuana, but otherwise denies any drug or alcohol use.  She states that she needs help, and does not know where to turn, so she came to the emergency department.  The history is provided by the patient. No language interpreter was used.    Past Medical History:  Diagnosis Date  . ADHD (attention deficit hyperactivity disorder)   . Anxiety   . Depression   . Migraine   . 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 UTI (urinary tract infection)    can sometimes get chronic UTIs for "months"    Patient Active Problem List   Diagnosis Date Noted  . Mild single current episode of major depressive disorder (HCC) 11/06/2016  . Attention deficit disorder 11/06/2016  . Anxiety, generalized 11/06/2016  . Irregular periods 07/31/2015    Past Surgical History:  Procedure Laterality Date  . KIDNEY SURGERY  at 43 weeks old  . KIDNEY SURGERY       OB History   No obstetric history on file.      Home Medications    Prior to Admission medications   Medication Sig Start Date End Date Taking? Authorizing Provider  LORazepam (ATIVAN) 0.5 MG tablet Take 0.5 mg by mouth every 6 (six) hours as needed for anxiety.    Yes [provider]  Norgestimate-Ethinyl Estradiol Triphasic 0.18/0.215/0.25 MG-25 MCG tab Take 1 tablet by mouth daily. Patient not taking: Reported on 06/27/2019  05/12/19   Jarold Motto, PA    Family History Family History  Problem Relation Age of Onset  . Hypertension Mother   . Anxiety disorder Mother   . Depression Mother   . Thyroid disease Mother   . Hypertension Father   . Heart attack Father   . Colon cancer Neg Hx     Social History Social History   Tobacco Use  . Smoking status: Never Smoker  . Smokeless tobacco: Never Used  Substance Use Topics  . Alcohol use: Yes    Comment: once a month one drink  . Drug use: Not Currently    Types: Marijuana     Allergies   Sulfa antibiotics, Cephalosporins, Latex, Penicillins, and Tape   Review of Systems Review of Systems  All other systems reviewed and are negative.    Physical Exam Updated Vital Signs BP 123/76   Pulse 74   Temp 98.5 F (36.9 C) (Oral)   Resp 20   LMP 05/27/2019   SpO2 98%   Physical Exam Vitals signs and nursing note reviewed.  Constitutional:      General: She is not in acute distress.    Appearance: She is well-developed.  HENT:     Head: Normocephalic and atraumatic.  Eyes:     Conjunctiva/sclera: Conjunctivae normal.  Neck:     Musculoskeletal: Neck supple.  Cardiovascular:     Rate and Rhythm: Normal rate and regular rhythm.     Heart sounds: No murmur.  Pulmonary:     Effort: Pulmonary effort is normal. No respiratory distress.     Breath sounds: Normal breath sounds.  Abdominal:     Palpations: Abdomen is soft.     Tenderness: There is no abdominal tenderness.  Musculoskeletal: Normal range of motion.  Skin:    General: Skin is warm and dry.  Neurological:     Mental Status: She is alert and oriented to person, place, and time.  Psychiatric:     Comments: Withdrawn      ED Treatments / Results  Labs (all labs ordered are listed, but only abnormal results are displayed) Labs Reviewed  COMPREHENSIVE METABOLIC PANEL - Abnormal; Notable for the following components:      Result Value   Potassium 3.1 (*)    Glucose,  Bld 115 (*)    Creatinine, Ser 1.15 (*)    Total Bilirubin 1.4 (*)    All other components within normal limits  ACETAMINOPHEN LEVEL - Abnormal; Notable for the following components:   Acetaminophen (Tylenol), Serum <10 (*)    All other components within normal limits  ETHANOL  SALICYLATE LEVEL  CBC  RAPID URINE DRUG SCREEN, HOSP PERFORMED  I-STAT BETA HCG BLOOD, ED ([facility], [facility], [facility] ONLY)    EKG None  Radiology No results found.  Procedures Procedures (including critical care time)  Medications Ordered in ED Medications  potassium chloride SA (KLOR-CON) CR tablet 40 mEq (has no administration in time range)     Initial Impression / Assessment and Plan / ED Course  I have reviewed the triage vital signs and the nursing notes.  Pertinent labs & imaging results that were available during my care of the patient were reviewed by me and considered in my medical decision making (see chart for details).        Patient here for worsening depression.  She asks if she can speak with a counselor.  She denies any SI or HI.  She is medically clear.  TTS states not a candidate for inpatient.  Will fax over resources.  Patient stable for discharge and outpatient follow-up.  Final Clinical Impressions(s) / ED Diagnoses   Final diagnoses:  Depression, unspecified depression type    ED Discharge Orders    None       Montine Circle, PA-C 06/27/19 0543    Veryl Speak, MD 06/27/19 [phone]

## 2019-06-27 NOTE — Telephone Encounter (Signed)
Tried to contact pt unable to leave message voicemail box not set up yet. Pt needs to schedule ED follow up tomorrow per [facility].

## 2019-06-27 NOTE — Progress Notes (Signed)
Per Corene Cornea, NP pt does not meet criteria for inpt tx and recommends pt follow up with OPT referrals. EDP Delaine Lame and pt's nurse Cristela Felt, RN have been advised. TTS faxed OPT referrals to [phone] as requested.  Lind Covert, MSW, LCSW Therapeutic Triage Specialist  [phone]

## 2019-06-27 NOTE — Discharge Instructions (Addendum)
Please use the contact information provided to get in touch with a counselor.

## 2019-06-27 NOTE — ED Notes (Signed)
Pt currently denying any SI thoughts

## 2019-06-28 NOTE — Telephone Encounter (Signed)
Tried to contact pt unable to leave message voicemail box not set up yet. 

## 2019-07-04 ENCOUNTER — Ambulatory Visit: Payer: Medicaid Other | Admitting: Physician Assistant

## 2019-07-15 ENCOUNTER — Encounter: Payer: Self-pay | Admitting: Physician Assistant

## 2019-07-15 LAB — IRON AND TIBC
Iron: 37
TIBC: 359
UIBC: 322

## 2019-07-15 LAB — T3: T3, Total: 94.74

## 2019-07-15 LAB — T4, FREE: Free T4: 1.48

## 2019-07-15 LAB — ESTIMATED GFR: EGFR (Non-African Amer.): 60.81

## 2019-07-15 LAB — FERRITIN: Ferritin: 95.4

## 2021-07-09 ENCOUNTER — Other Ambulatory Visit: Payer: Self-pay

## 2021-07-09 ENCOUNTER — Ambulatory Visit (INDEPENDENT_AMBULATORY_CARE_PROVIDER_SITE_OTHER): Payer: Medicaid Other | Admitting: Physician Assistant

## 2021-07-09 ENCOUNTER — Encounter: Payer: Self-pay | Admitting: Physician Assistant

## 2021-07-09 ENCOUNTER — Other Ambulatory Visit: Payer: Self-pay | Admitting: Physician Assistant

## 2021-07-09 VITALS — BP 102/60 | HR 111 | Temp 98.0°F | Ht 63.0 in | Wt 88.4 lb

## 2021-07-09 DIAGNOSIS — Z23 Encounter for immunization: Secondary | ICD-10-CM | POA: Diagnosis not present

## 2021-07-09 DIAGNOSIS — F411 Generalized anxiety disorder: Secondary | ICD-10-CM | POA: Diagnosis not present

## 2021-07-09 DIAGNOSIS — R636 Underweight: Secondary | ICD-10-CM | POA: Diagnosis not present

## 2021-07-09 DIAGNOSIS — R300 Dysuria: Secondary | ICD-10-CM | POA: Diagnosis not present

## 2021-07-09 DIAGNOSIS — F320 Major depressive disorder, single episode, mild: Secondary | ICD-10-CM | POA: Diagnosis not present

## 2021-07-09 DIAGNOSIS — N926 Irregular menstruation, unspecified: Secondary | ICD-10-CM

## 2021-07-09 LAB — COMPREHENSIVE METABOLIC PANEL
ALT: 9 U/L (ref 0–35)
AST: 21 U/L (ref 0–37)
Albumin: 4.8 g/dL (ref 3.5–5.2)
Alkaline Phosphatase: 41 U/L (ref 39–117)
BUN: 16 mg/dL (ref 6–23)
CO2: 25 mEq/L (ref 19–32)
Calcium: 9.5 mg/dL (ref 8.4–10.5)
Chloride: 103 mEq/L (ref 96–112)
Creatinine, Ser: 1.06 mg/dL (ref 0.40–1.20)
GFR: 74.64 mL/min (ref >60.00)
Glucose, Bld: 92 mg/dL (ref 70–99)
Potassium: 3.9 mEq/L (ref 3.5–5.1)
Sodium: 137 mEq/L (ref 135–145)
Total Bilirubin: 0.8 mg/dL (ref 0.2–1.2)
Total Protein: 7.6 g/dL (ref 6.0–8.3)

## 2021-07-09 LAB — POC URINALSYSI DIPSTICK (AUTOMATED)
Bilirubin, UA: NEGATIVE
Blood, UA: NEGATIVE
Glucose, UA: NEGATIVE
Ketones, UA: NEGATIVE
Leukocytes, UA: NEGATIVE
Nitrite, UA: NEGATIVE
Protein, UA: NEGATIVE
Spec Grav, UA: 1.015 (ref 1.010–1.025)
Urobilinogen, UA: 0.2 E.U./dL
pH, UA: 6 (ref 5.0–8.0)

## 2021-07-09 LAB — CBC WITH DIFFERENTIAL/PLATELET
Basophils Absolute: 0.1 10*3/uL (ref 0.0–0.1)
Basophils Relative: 0.7 % (ref 0.0–3.0)
Eosinophils Absolute: 0.1 10*3/uL (ref 0.0–0.7)
Eosinophils Relative: 1 % (ref 0.0–5.0)
HCT: 37.1 % (ref 36.0–46.0)
Hemoglobin: 12.3 g/dL (ref 12.0–15.0)
Lymphocytes Relative: 24.6 % (ref 12.0–46.0)
Lymphs Abs: 1.9 10*3/uL (ref 0.7–4.0)
MCHC: 33.2 g/dL (ref 30.0–36.0)
MCV: 93.1 fl (ref 78.0–100.0)
Monocytes Absolute: 0.6 10*3/uL (ref 0.1–1.0)
Monocytes Relative: 7.6 % (ref 3.0–12.0)
Neutro Abs: 5 10*3/uL (ref 1.4–7.7)
Neutrophils Relative %: 66.1 % (ref 43.0–77.0)
Platelets: 197 10*3/uL (ref 150.0–400.0)
RBC: 3.99 Mil/uL (ref 3.87–5.11)
RDW: 12.7 % (ref 11.5–15.5)
WBC: 7.5 10*3/uL (ref 4.0–10.5)

## 2021-07-09 LAB — TSH: TSH: 1.99 u[IU]/mL (ref 0.35–5.50)

## 2021-07-09 LAB — VITAMIN B12: Vitamin B-12: 236 pg/mL (ref 211–911)

## 2021-07-09 LAB — POCT URINE PREGNANCY: Preg Test, Ur: NEGATIVE

## 2021-07-09 MED ORDER — NORETHIN ACE-ETH ESTRAD-FE 1-20 MG-MCG PO TABS: 1.0000 | ORAL_TABLET | Freq: Every day | ORAL | 0 refills | Status: DC

## 2021-07-09 MED ORDER — MIRTAZAPINE 7.5 MG PO TABS: 7.5000 mg | ORAL_TABLET | Freq: Every day | ORAL | 0 refills | Status: DC

## 2021-07-09 NOTE — Progress Notes (Signed)
Calani D Haynes is a 20 y.o. female here for anxiety/depression.  History of Present Illness:   Chief Complaint  Patient presents with   Anxiety   Depression    HPI  Ms. Janowski has presented today with her significant other.   Anxiety/Depression In 2020, Flois presented to the ED with c/o severe depression. At the time she reported erratic mood swings but denied hallucinations and SI/HI. She stated she knew she needed help and didn't know where to turn.  Due to her anxiety she has not been working and currently has a shaky relationship with her family. According to her and her significant other, they have been "hopping" around in terms of living. They have been receiving immense support from her significant other's family and are working to figure out a permanent situation. Denies SI/HI.  Underweight According to her mother, Jasimine has had severe weight loss, cold intolerance, dry skin, hair loss, feelings of dizziness during menstrual cycle, high anxiety, shaky hands, brain fog, depression, fatigue, and feeling shaky between meals.   Upon her previous visit here, 05/18/19, she was experiencing nausea but this has since been resolved.  She has been experiencing constipation and has not taken any OTC medications. Although her appetite has increased, she hasn't been able to retain any weight. [employer] states she usually eats a good portion of her meals, although she doesn't always have access to food due to her environment. She has been experiencing constipation but denies nausea, diarrhea, or vomitting.    Ms. Hedger has been participating in marijuana use, to which she finds this very helpful when it comes to appetite and mood increase.   Contraception; Irregular periods Patient state the longest she has gone without a period has been two years. Currently she is experiencing irregular periods that are usually heavy. She would like to restart a birth control in order to regularize her  cycles as well as protect against conception.   UTI symptoms Patient reports having a hx of UTIs as a child and believes she has been having a few recently. She has admitted to drinking high amounts of soda and sweet tea, but tries to increase water intake. Associated symptoms are Cloudy urine, pain after urination, lower back pain. Denies hematuria, vaginal discharge, urine urgency, fever, or chills.    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   UTI (urinary tract infection)    can sometimes get chronic UTIs for "months"     Social History   Tobacco Use   Smoking status: Never   Smokeless tobacco: Never  Vaping Use   Vaping Use: Every day  Substance Use Topics   Alcohol use: Yes    Comment: once a month one drink   Drug use: Not Currently    Types: Marijuana    Past Surgical History:  Procedure Laterality Date   KIDNEY SURGERY  at 50 weeks old   KIDNEY SURGERY      Family History  Problem Relation Age of Onset   Hypertension Mother    Anxiety disorder Mother    Depression Mother    Thyroid disease Mother    Hypertension Father    Heart attack Father    Colon cancer Neg Hx     Allergies  Allergen Reactions   Sulfa Antibiotics Swelling    potential   Cephalosporins Hives and Swelling   Latex Other (See Comments)    blisters   Penicillins Hives and Swelling   Tape Rash    Current Medications:   Current Outpatient Medications:    mirtazapine (REMERON) 7.5 MG tablet, Take 1 tablet (7.5 mg total) by mouth at bedtime., Disp: 90 tablet, Rfl: 0   norethindrone-ethinyl estradiol-FE (JUNEL FE 1/20) 1-20 MG-MCG tablet, Take 1 tablet by mouth daily., Disp: 84 tablet, Rfl: 0   Review of Systems:   ROS Negative unless otherwise specified per HPI.  Vitals:   Vitals:    07/09/21 0908  BP: 102/60  Pulse: (!) 111  Temp: 98 F (36.7 C)  TempSrc: Temporal  SpO2: 99%  Weight: 88 lb 6.1 oz (40.1 kg)  Height: 5\' 3"  (1.6 m)     Body mass index is 15.66 kg/m.  Physical Exam:   Physical Exam Vitals and nursing note reviewed.  Constitutional:      General: She is not in acute distress.    Appearance: She is well-developed. She is not ill-appearing or toxic-appearing.  Cardiovascular:     Rate and Rhythm: Normal rate and regular rhythm.     Pulses: Normal pulses.     Heart sounds: Normal heart sounds, S1 normal and S2 normal.  Pulmonary:     Effort: Pulmonary effort is normal.     Breath sounds: Normal breath sounds.  Abdominal:     Tenderness: There is no right CVA tenderness or left CVA tenderness.  Skin:    General: Skin is warm and dry.  Neurological:     Mental Status: She is alert.     GCS: GCS eye subscore is 4. GCS verbal subscore is 5. GCS motor subscore is 6.  Psychiatric:        Speech: Speech normal.        Behavior: Behavior normal. Behavior is cooperative.   Results for orders placed or performed in visit on 07/09/21  POCT urine pregnancy  Result Value Ref Range   Preg Test, Ur Negative Negative  POCT Urinalysis Dipstick (Automated)  Result Value Ref Range   Color, UA yellow    Clarity, UA clear    Glucose, UA Negative Negative   Bilirubin, UA neg    Ketones, UA neg    Spec Grav, UA 1.015 1.010 - 1.025   Blood, UA neg    pH, UA 6.0 5.0 - 8.0   Protein, UA Negative Negative   Urobilinogen, UA 0.2 0.2 or 1.0 E.U./dL   Nitrite, UA neg    Leukocytes, UA Negative Negative     Assessment and Plan:   Mild single current episode of major depressive disorder (HCC); Anxiety, generalized Uncontrolled Denies SI/HI at todays visit I discussed with patient that if they develop any SI, to tell someone immediately and seek medical attention. Therapy handout provided for her to call for local therapists [facility] information  provided Start remeron 7.5 mg daily Follow-up two weeks after this  Irregular periods Suspect due to underweight status Update blood work today Restart OCP I also recommended [facility] to discuss IUD or Nexplanon for more effective birth control Follow-up as needed with me  Underweight Ongoing Encouraged more consistent protein intake Update blood work today Start remeron 7.5 mg daily Follow-up 2 weeks after starting  Dysuria UA negative No red flags on discussion Encouraged adequate hydration Will follow up on urine culture results and  order appropriate treatment if needed  I,Havlyn C Ratchford,acting as a scribe for [employer], PA.,have documented all relevant documentation on the behalf of Jarold Motto, PA,as directed by  Jarold Motto, PA while in the presence of Jarold Motto, [place].  I, Jarold Motto, [place], have reviewed all documentation for this visit. The documentation on 07/09/21 for the exam, diagnosis, procedures, and orders are all accurate and complete.   Jarold Motto, PA-C

## 2021-07-09 NOTE — Patient Instructions (Signed)
It was great to see you!  --Start birth control pills  --After two weeks, if no significant symptoms with tolerating birth control, start 7.5 mg remeron nightly -- this is for your mood and your appetite --Follow-up with me in two weeks  I strongly recommend that if you do not desire pregnancy that you go to [facility] to discuss IUD or nexplanon to help prevent pregnancy while you currently have insurance in case your financial situation worsens or the political landscape changes in the near future!  Please look at the resource provided and call to find a therapist.  If you develop suicidal thoughts, please tell someone and immediately proceed to our local 24/7 crisis center, [facility]. [address], [place], [place] [address] [phone].  Let's follow-up in 1 month, sooner if you have concerns.  Take care,  Jarold Motto PA-C

## 2021-07-09 NOTE — Telephone Encounter (Signed)
Please see message from pharmacy.

## 2021-07-10 LAB — IRON,TIBC AND FERRITIN PANEL
%SAT: 42 % (calc) (ref 16–45)
Ferritin: 43 ng/mL (ref 16–154)
Iron: 115 ug/dL (ref 40–190)
TIBC: 277 mcg/dL (calc) (ref 250–450)

## 2021-07-10 LAB — URINE CULTURE
MICRO NUMBER:: 12517251
Result:: NO GROWTH
SPECIMEN QUALITY:: ADEQUATE

## 2021-07-10 LAB — SPECIMEN COMPROMISED

## 2021-08-08 ENCOUNTER — Ambulatory Visit: Payer: Medicaid Other | Admitting: Physician Assistant

## 2021-08-08 NOTE — Progress Notes (Incomplete)
Alice Armstrong is a 20 y.o. female here for a follow up of depression and anxiety.  SCRIBE STATEMENT  History of Present Illness:   No chief complaint on file.   HPI   Depression/Anxiety  Since our prior visit on 07/09/21, Alice Armstrong has been compliant with remeron 7.5 mg daily with no adverse effects. *** Denies ***.   Underweight  Ms. Dewilde states ***.  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   UTI (urinary tract infection)    can sometimes get chronic UTIs for "months"     Social History   Tobacco Use   Smoking status: Never   Smokeless tobacco: Never  Vaping Use   Vaping Use: Every day  Substance Use Topics   Alcohol use: Yes    Comment: once a month one drink   Drug use: Not Currently    Types: Marijuana    Past Surgical History:  Procedure Laterality Date   KIDNEY SURGERY  at 2 weeks old   KIDNEY SURGERY      Family History  Problem Relation Age of Onset   Hypertension Mother    Anxiety disorder Mother    Depression Mother    Thyroid disease Mother    Hypertension Father    Heart attack Father    Colon cancer Neg Hx     Allergies  Allergen Reactions   Sulfa Antibiotics Swelling    potential   Cephalosporins Hives and Swelling   Latex Other (See Comments)    blisters   Penicillins Hives and Swelling   Tape Rash    Current Medications:   Current Outpatient Medications:    mirtazapine (REMERON) 7.5 MG tablet, Take 1 tablet (7.5 mg total) by mouth at bedtime., Disp: 90 tablet, Rfl: 0   TRI-LO-SPRINTEC 0.18/0.215/0.25 MG-25 MCG tab, TAKE 1 TABLET BY MOUTH EVERY DAY, Disp: 28 tablet, Rfl: 11   Review of Systems:   ROS Negative unless otherwise specified per HPI. Vitals:   There were no vitals filed for this visit.   There is no height or weight  on file to calculate BMI.  Physical Exam:   Physical Exam  Assessment and Plan:       I,Havlyn C Ratchford,acting as a scribe for Jarold Motto, PA.,have documented all relevant documentation on the behalf of Jarold Motto, PA,as directed by  Jarold Motto, PA while in the presence of Jarold Motto, [place].  ***  Jarold Motto, PA-C

## 2021-08-19 ENCOUNTER — Ambulatory Visit: Payer: Medicaid Other | Admitting: Physician Assistant

## 2021-08-19 ENCOUNTER — Encounter: Payer: Self-pay | Admitting: Physician Assistant

## 2021-08-19 ENCOUNTER — Other Ambulatory Visit: Payer: Self-pay

## 2021-08-19 VITALS — BP 108/66 | HR 92 | Temp 98.2°F | Ht 63.0 in | Wt 88.0 lb

## 2021-08-19 DIAGNOSIS — F411 Generalized anxiety disorder: Secondary | ICD-10-CM | POA: Diagnosis not present

## 2021-08-19 DIAGNOSIS — N926 Irregular menstruation, unspecified: Secondary | ICD-10-CM

## 2021-08-19 DIAGNOSIS — F320 Major depressive disorder, single episode, mild: Secondary | ICD-10-CM | POA: Diagnosis not present

## 2021-08-19 MED ORDER — MIRTAZAPINE 7.5 MG PO TABS: 7.5000 mg | ORAL_TABLET | Freq: Every day | ORAL | 0 refills | Status: DC

## 2021-08-19 MED ORDER — BUSPIRONE HCL 10 MG PO TABS: 10.0000 mg | ORAL_TABLET | Freq: Two times a day (BID) | ORAL | 0 refills | Status: DC | PRN

## 2021-08-19 NOTE — Patient Instructions (Signed)
It was great to see you!  Continue birth control pills  Continue remeron 7.5 mg daily  Start buspar 10 mg twice daily AS NEEDED for anxiety/panic attacks  Let's follow-up in 3 months, sooner if you have concerns.  Take care,  Jarold Motto PA-C

## 2021-08-19 NOTE — Progress Notes (Signed)
Alice Armstrong is a 20 y.o. female here for a follow up of anxiety/depression.   History of Present Illness:   Chief Complaint  Patient presents with   Anxiety   Depression    HPI  Alice Armstrong presented to today's visit with her significant other.   Anxiety/Depression Currently compliant with taking remeron 7.5 mg daily with no adverse effects. Alice Armstrong states she has noticed it's become easier to get through her days. She initially felt fatigued upon starting the medication, but this shortly resolved itself. Denies SI/HI. She is managing well.   Although her depressed mood has improved, she says her anxiety has remained increased.  Reports she is having at least two panic attacks a week. Due to this she is interested in restarting medication for the issue such as lorazepam. In the past she has taken lorazepam but notes she may need a higher dosage. Denies SI/HI. She was also on propanolol and klonopin prior to starting the lorazepam, but those medications caused extreme fatigue.   Irregular Periods Alice Armstrong has been compliant with taking tri- lo sprintec 25 mcg daily with no adverse effects. She says that this first pill of the month does cause GI upset, but nothing that she in concerned about since it resolves fairly quickly. Alice Armstrong also believes she is gaining weight since she has more of an appetite since starting the medication. Denies any spotting.   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   UTI (urinary tract infection)    can sometimes get chronic UTIs for "months"     Social History   Tobacco Use   Smoking status: Never   Smokeless tobacco: Never  Vaping Use   Vaping Use: Every day  Substance Use Topics   Alcohol use: Yes    Comment: once a month one drink   Drug use: Not  Currently    Types: Marijuana    Past Surgical History:  Procedure Laterality Date   KIDNEY SURGERY  at 36 weeks old   KIDNEY SURGERY      Family History  Problem Relation Age of Onset   Hypertension Mother    Anxiety disorder Mother    Depression Mother    Thyroid disease Mother    Hypertension Father    Heart attack Father    Colon cancer Neg Hx     Allergies  Allergen Reactions   Sulfa Antibiotics Swelling    potential   Cephalosporins Hives and Swelling   Latex Other (See Comments)    blisters   Penicillins Hives and Swelling   Tape Rash    Current Medications:   Current Outpatient Medications:    busPIRone (BUSPAR) 10 MG tablet, Take 1 tablet (10 mg total) by mouth 2 (two) times daily as needed (anxiety)., Disp: 60 tablet, Rfl: 0   TRI-LO-SPRINTEC 0.18/0.215/0.25 MG-25 MCG tab, TAKE 1 TABLET BY MOUTH EVERY DAY, Disp: 28 tablet, Rfl: 11   mirtazapine (REMERON) 7.5 MG tablet, Take 1 tablet (7.5 mg total) by mouth at bedtime., Disp: 90 tablet, Rfl: 0   Review of Systems:   ROS Negative unless otherwise specified per HPI. Vitals:   Vitals:   08/19/21 1059  BP: 108/66  Pulse: 92  Temp: 98.2 F (36.8 C)  TempSrc:  Temporal  SpO2: 98%  Weight: 88 lb (39.9 kg)  Height: 5\' 3"  (1.6 m)     Body mass index is 15.59 kg/m.  Physical Exam:   Physical Exam Constitutional:      Appearance: Normal appearance. She is well-developed.  HENT:     Head: Normocephalic and atraumatic.  Eyes:     General: Lids are normal.     Extraocular Movements: Extraocular movements intact.     Conjunctiva/sclera: Conjunctivae normal.  Pulmonary:     Effort: Pulmonary effort is normal.  Musculoskeletal:        General: Normal range of motion.     Cervical back: Normal range of motion and neck supple.  Skin:    General: Skin is warm and dry.  Neurological:     Mental Status: She is alert and oriented to person, place, and time.  Psychiatric:        Attention and Perception:  Attention and perception normal.        Mood and Affect: Mood normal.        Behavior: Behavior normal.        Thought Content: Thought content normal.        Judgment: Judgment normal.    Assessment and Plan:   Mild single current episode of major depressive disorder (HCC) Improved from last visit Offered increase of remeron but she would like to continue remeron 7.5 mg daily Denies SI/HI at todays visit I discussed with patient that if they develop any SI, to tell someone immediately and seek medical attention.  Follow-up in 3 months, sooner if concerns  Anxiety, generalized Uncontrolled  Start buspar 10 mg BID prn Follow up in 3 months, sooner if concerns occur  Irregular periods Resolved with OCP Continue taking Tri- Lo Sprintec 25 mcg daily  Follow up as needed with me   C Ratchford,acting as a scribe for Bobetta Lime, PA.,have documented all relevant documentation on the behalf of [employer], PA,as directed by  Jarold Motto, PA while in the presence of Jarold Motto, Jarold Motto.  I, [place], Jarold Motto, have reviewed all documentation for this visit. The documentation on 08/19/21 for the exam, diagnosis, procedures, and orders are all accurate and complete.   08/21/21, PA-C

## 2021-11-19 ENCOUNTER — Encounter: Payer: Self-pay | Admitting: Physician Assistant

## 2021-11-19 ENCOUNTER — Other Ambulatory Visit: Payer: Self-pay

## 2021-11-19 ENCOUNTER — Telehealth (INDEPENDENT_AMBULATORY_CARE_PROVIDER_SITE_OTHER): Payer: Medicaid Other | Admitting: Physician Assistant

## 2021-11-19 VITALS — Ht 63.0 in | Wt 88.0 lb

## 2021-11-19 DIAGNOSIS — F320 Major depressive disorder, single episode, mild: Secondary | ICD-10-CM

## 2021-11-19 DIAGNOSIS — F411 Generalized anxiety disorder: Secondary | ICD-10-CM | POA: Diagnosis not present

## 2021-11-19 MED ORDER — MIRTAZAPINE 15 MG PO TABS: 15.0000 mg | ORAL_TABLET | Freq: Every day | ORAL | 1 refills | Status: DC

## 2021-11-19 NOTE — Progress Notes (Signed)
° °  I acted as a Education administrator for [employer], PA-C Anselmo Pickler, LPN  Virtual Visit via Video Note   I, Inda Coke, connected with  Alice Armstrong  ([id number], 05/12/2001) on 11/19/21 at 10:30 AM EST by a video-enabled telemedicine application and verified that I am speaking with the correct person using two identifiers.  Location: Patient: Home Provider: [place] office   I discussed the limitations of evaluation and management by telemedicine and the availability of in person appointments. The patient expressed understanding and agreed to proceed.    History of Present Illness: Alice Armstrong is a 21 y.o. who identifies as a female who was assigned female at birth, and is being seen today for depression/anxiety.   Pt says she never started Buspar, she does not want to take something everyday. Pt says she would rather take something for Panic attacks. She is having panic attacks 3-4 times a week, crying spells.  She is doing well with remeron. Feels like its helping her appetite, although she hasn't had weight gain, she feels like it has helped keep her from losing weight.  Denies SI/HI. Lives with boyfriend, has good support system.  Problems:  Patient Active Problem List   Diagnosis Date Noted   Mild single current episode of major depressive disorder ([place]) 11/06/2016   Attention deficit disorder 11/06/2016   Anxiety, generalized 11/06/2016   Irregular periods 07/31/2015    Allergies:  Allergies  Allergen Reactions   Sulfa Antibiotics Swelling    potential   Cephalosporins Hives and Swelling   Latex Other (See Comments)    blisters   Penicillins Hives and Swelling   Tape Rash   Medications:  Current Outpatient Medications:    mirtazapine (REMERON) 15 MG tablet, Take 1 tablet (15 mg total) by mouth at bedtime., Disp: 90 tablet, Rfl: 1   TRI-LO-SPRINTEC 0.18/0.215/0.25 MG-25 MCG tab, TAKE 1 TABLET BY MOUTH EVERY DAY, Disp: 28 tablet, Rfl:  11  Observations/Objective: Patient is well-developed, well-nourished in no acute distress.  Resting comfortably  at home.  Head is normocephalic, atraumatic.  No labored breathing.  Speech is clear and coherent with logical content.  Patient is alert and oriented at baseline.   Assessment and Plan: Mild single current episode of major depressive disorder ([place]); Anxiety, generalized Slight improvement Increase remeron to 15 mg daily Discussed current rx of buspar -- recommend that she trial this prn for panic attacks/increased anxiety Follow-up prn I discussed with patient that if they develop any SI, to tell someone immediately and seek medical attention.  Follow Up Instructions: I discussed the assessment and treatment plan with the patient. The patient was provided an opportunity to ask questions and all were answered. The patient agreed with the plan and demonstrated an understanding of the instructions.  A copy of instructions were sent to the patient via MyChart unless otherwise noted below.   The patient was advised to call back or seek an in-person evaluation if the symptoms worsen or if the condition fails to improve as anticipated.  Inda Coke, [place]

## 2021-12-18 ENCOUNTER — Other Ambulatory Visit: Payer: Self-pay | Admitting: Physician Assistant

## 2021-12-18 NOTE — Telephone Encounter (Signed)
The original prescription was discontinued on 11/19/2021  for the following reason: Prescription never filled. Renewing this prescription may not be appropriate ?

## 2022-02-25 ENCOUNTER — Telehealth (INDEPENDENT_AMBULATORY_CARE_PROVIDER_SITE_OTHER): Payer: Medicaid Other | Admitting: Physician Assistant

## 2022-02-25 ENCOUNTER — Encounter: Payer: Self-pay | Admitting: Physician Assistant

## 2022-02-25 VITALS — Ht 63.0 in | Wt 95.0 lb

## 2022-02-25 DIAGNOSIS — F411 Generalized anxiety disorder: Secondary | ICD-10-CM | POA: Diagnosis not present

## 2022-02-25 MED ORDER — LORAZEPAM 0.5 MG PO TABS: 0.2500 mg | ORAL_TABLET | Freq: Every day | ORAL | 0 refills | Status: DC | PRN

## 2022-02-25 MED ORDER — MIRTAZAPINE 15 MG PO TABS: 15.0000 mg | ORAL_TABLET | Freq: Every day | ORAL | 1 refills | Status: DC

## 2022-02-25 NOTE — Progress Notes (Signed)
   I acted as a Education administrator for [employer], PA-C Anselmo Pickler, LPN  Virtual Visit via Video Note   Faythe Dingwall, PA , connected with  AVIE HELMLY  ([id number], [birth year]) on 02/25/22 at  2:00 PM EDT by a video-enabled telemedicine application and verified that I am speaking with the correct person using two identifiers.  Location: Patient: Home Provider: [place] office   I discussed the limitations of evaluation and management by telemedicine and the availability of in person appointments. The patient expressed understanding and agreed to proceed.    History of Present Illness: Alice Armstrong is a 21 y.o. who identifies as a female who was assigned female at birth, and is being seen today for anxiety. Pt said she stopped the Buspar about a week due to ineffective. She also states that it made her sensation of palpitations worse when she was taking this. Pt said she is still having panic attacks up to 4 x a week and sometimes none during the week. Remeron is helping her appetite. Denies SI/HI.   Problems:  Patient Active Problem List   Diagnosis Date Noted   Mild single current episode of major depressive disorder ([place]) 11/06/2016   Attention deficit disorder 11/06/2016   Anxiety, generalized 11/06/2016   Irregular periods 07/31/2015    Allergies:  Allergies  Allergen Reactions   Sulfa Antibiotics Swelling    potential   Cephalosporins Hives and Swelling   Latex Other (See Comments)    blisters   Penicillins Hives and Swelling   Tape Rash   Medications:  Current Outpatient Medications:    LORazepam (ATIVAN) 0.5 MG tablet, Take 0.5 tablets (0.25 mg total) by mouth daily as needed for anxiety., Disp: 30 tablet, Rfl: 0   TRI-LO-SPRINTEC 0.18/0.215/0.25 MG-25 MCG tab, TAKE 1 TABLET BY MOUTH EVERY DAY, Disp: 28 tablet, Rfl: 11   mirtazapine (REMERON) 15 MG tablet, Take 1 tablet (15 mg total) by mouth at bedtime., Disp: 90 tablet, Rfl:  1  Observations/Objective: Patient is well-developed, well-nourished in no acute distress.  Resting comfortably  at home.  Head is normocephalic, atraumatic.  No labored breathing.  Speech is clear and coherent with logical content.  Patient is alert and oriented at baseline.   Assessment and Plan: 1. Anxiety, generalized Uncontrolled Stop buspar -- as she is not tolerating this  She is requesting ativan, as she has tolerated this well in the past. I do not think that this is unreasonable. Recommend 0.25 mg (1/2 tablet) of ativan prn. I discussed that this is not a long-term, daily use medication. If she requires this medication more frequently than I am comfortable, I will plan to change her medication or send to psychiatry for management -- she is in agreement to this.   Follow Up Instructions: I discussed the assessment and treatment plan with the patient. The patient was provided an opportunity to ask questions and all were answered. The patient agreed with the plan and demonstrated an understanding of the instructions.  A copy of instructions were sent to the patient via MyChart unless otherwise noted below.   The patient was advised to call back or seek an in-person evaluation if the symptoms worsen or if the condition fails to improve as anticipated.  Inda Coke, [place]

## 2022-06-09 ENCOUNTER — Other Ambulatory Visit: Payer: Self-pay | Admitting: Physician Assistant

## 2022-07-14 ENCOUNTER — Telehealth: Payer: Self-pay | Admitting: Physician Assistant

## 2022-07-14 MED ORDER — NORGESTIM-ETH ESTRAD TRIPHASIC 0.18/0.215/0.25 MG-25 MCG PO TABS: 1.0000 | ORAL_TABLET | Freq: Every day | ORAL | 1 refills | Status: DC

## 2022-07-14 NOTE — Telephone Encounter (Signed)
.   Encourage patient to contact the pharmacy for refills or they can request refills through [place]:  11/19/21  NEXT APPOINTMENT DATE:  MEDICATION:  TRI-LO-SPRINTEC 0.18/0.215/0.25 MG-25 MCG tab  Is the patient out of medication?   PHARMACY:  [id number] - [place], [place] - [address]. AT [address] Phone: [phone]  Fax: [phone]      Let patient know to contact pharmacy at the end of the day to make sure medication is ready.  Please notify patient to allow 48-72 hours to process

## 2022-07-14 NOTE — Telephone Encounter (Signed)
Spoke to pt told her birth control pills were sent to the pharmacy. Also you need to schedule an appt for a physical. Pt verbalized understanding.

## 2022-09-02 ENCOUNTER — Other Ambulatory Visit: Payer: Self-pay | Admitting: Physician Assistant

## 2022-09-03 MED ORDER — LORAZEPAM 0.5 MG PO TABS
0.2500 mg | ORAL_TABLET | Freq: Every day | ORAL | 0 refills | Status: DC | PRN
Start: 2022-09-03 — End: 2022-09-10

## 2022-09-03 NOTE — Telephone Encounter (Signed)
Pt requesting refill for Lorazepa 0.5 mg. Last visit 02/25/2022.

## 2022-09-10 ENCOUNTER — Ambulatory Visit (INDEPENDENT_AMBULATORY_CARE_PROVIDER_SITE_OTHER): Payer: Medicaid Other | Admitting: Physician Assistant

## 2022-09-10 ENCOUNTER — Encounter: Payer: Self-pay | Admitting: Physician Assistant

## 2022-09-10 VITALS — BP 104/60 | HR 94 | Temp 97.5°F | Ht 63.0 in | Wt 92.2 lb

## 2022-09-10 DIAGNOSIS — Z1159 Encounter for screening for other viral diseases: Secondary | ICD-10-CM | POA: Diagnosis not present

## 2022-09-10 DIAGNOSIS — F411 Generalized anxiety disorder: Secondary | ICD-10-CM

## 2022-09-10 DIAGNOSIS — Z124 Encounter for screening for malignant neoplasm of cervix: Secondary | ICD-10-CM | POA: Diagnosis not present

## 2022-09-10 DIAGNOSIS — G8929 Other chronic pain: Secondary | ICD-10-CM | POA: Diagnosis not present

## 2022-09-10 DIAGNOSIS — Z0000 Encounter for general adult medical examination without abnormal findings: Secondary | ICD-10-CM

## 2022-09-10 DIAGNOSIS — Z114 Encounter for screening for human immunodeficiency virus [HIV]: Secondary | ICD-10-CM | POA: Diagnosis not present

## 2022-09-10 DIAGNOSIS — R636 Underweight: Secondary | ICD-10-CM

## 2022-09-10 DIAGNOSIS — F320 Major depressive disorder, single episode, mild: Secondary | ICD-10-CM | POA: Diagnosis not present

## 2022-09-10 DIAGNOSIS — M25572 Pain in left ankle and joints of left foot: Secondary | ICD-10-CM | POA: Diagnosis not present

## 2022-09-10 MED ORDER — MIRTAZAPINE 30 MG PO TABS: 30.0000 mg | ORAL_TABLET | Freq: Every day | ORAL | 3 refills | Status: DC

## 2022-09-10 MED ORDER — TRI-LO-SPRINTEC 0.18/0.215/0.25 MG-25 MCG PO TABS: 1.0000 | ORAL_TABLET | Freq: Every day | ORAL | 3 refills | Status: DC

## 2022-09-10 MED ORDER — LORAZEPAM 0.5 MG PO TABS: 0.2500 mg | ORAL_TABLET | Freq: Every day | ORAL | 0 refills | Status: DC | PRN

## 2022-09-10 NOTE — Patient Instructions (Signed)
It was great to see you!  Referral to gynecology to get your pap smear Increase your remeron to 30 mg daily Message/call if you want [place] to get your ankle pain evaluated  Please go to the lab for blood work.   Our office will call you with your results unless you have chosen to receive results via MyChart.  If your blood work is normal we will follow-up each year for physicals and as scheduled for chronic medical problems.  If anything is abnormal we will treat accordingly and get you in for a follow-up.  Take care,  Alice Armstrong

## 2022-09-10 NOTE — Progress Notes (Signed)
Subjective:    Alice Armstrong is a 21 y.o. female and is here for a comprehensive physical exam.  HPI  Health Maintenance Due  Topic Date Due   HIV Screening  Never done   CHLAMYDIA SCREENING  12/09/2016   Hepatitis C Screening  Never done   PAP-Cervical Cytology Screening  Never done   PAP SMEAR-Modifier  Never done    Acute Concerns: None  Chronic Issues: Anxiety/Depression Compliant with Remeron 15mg  nightly. Has had some increased depression with the seasonal changes. Interested in increasing her dose. Using Ativan 0.25mg  when anxiety is most severe, only taking it twice a week or so.  Birth control management Doing well on Tri-Lo-Sprintec without any negative side effects. Has had improvement of her menstrual periods.  Chronic left ankle pain Onset many years ago. NKI. Intermittent, stabbing pain on anterior ankle particularly with palpation. Accompanying intermittent swelling of the ankle. Symptoms not limiting her. She declined offer for imaging or referral.   Health Maintenance: Immunizations -- UTD Colonoscopy -- N/A Mammogram -- N/A PAP -- Due- would like a referral for gyn for this Bone Density -- N/A Diet -- Eating well, living with her boyfriend at his mom's house.  Exercise -- Staying active, camping/hiking  Sleep habits -- Sleeping a bit more than she would like Mood -- Okay, some seasonal depression  UTD with dentist? - No UTD with eye doctor? - No Denies any headaches or vision changes.  Weight history: Wt Readings from Last 10 Encounters:  09/10/22 92 lb 4 oz (41.8 kg)  02/25/22 95 lb (43.1 kg)  11/19/21 88 lb (39.9 kg)  08/19/21 88 lb (39.9 kg)  07/09/21 88 lb 6.1 oz (40.1 kg)  06/08/19 87 lb 4 oz (39.6 kg)  05/18/19 87 lb 6.1 oz (39.6 kg)  05/11/19 87 lb (39.5 kg)  05/10/19 85 lb (38.6 kg)  01/11/16 115 lb (52.2 kg) (37 %, Z= -0.34)*   * Growth percentiles are based on CDC (Girls, 2-20 Years) data.   Body mass index is  16.34 kg/m. Patient's last menstrual period was 09/09/2022 (exact date).  Alcohol use:  reports current alcohol use.  Tobacco use:  Tobacco Use: High Risk (09/10/2022)   Patient History    Smoking Tobacco Use: Every Day    Smokeless Tobacco Use: Never    Passive Exposure: Not on file    Eligible for lung cancer screening? no     02/25/2022    2:10 PM  Depression screen PHQ 2/9  Decreased Interest 0  Down, Depressed, Hopeless 0  PHQ - 2 Score 0  Altered sleeping 0  Tired, decreased energy 1  Change in appetite 0  Feeling bad or failure about yourself  0  Trouble concentrating 2  Moving slowly or fidgety/restless 1  Suicidal thoughts 0  PHQ-9 Score 4  Difficult doing work/chores Somewhat difficult     Other providers/specialists: Patient Care Team: 4/9, Jarold Motto as PCP - General (Physician Assistant)    PMHx, SurgHx, SocialHx, Medications, and Allergies were reviewed in the Visit Navigator and updated as appropriate.   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   UTI (urinary tract infection)    can sometimes get chronic UTIs for "months"     Past  Surgical History:  Procedure Laterality Date   KIDNEY SURGERY  at 38 weeks old   KIDNEY SURGERY       Family History  Problem Relation Age of Onset   Hypertension Mother    Anxiety disorder Mother    Depression Mother    Thyroid disease Mother    Hypertension Father    Heart attack Father    Melanoma Maternal Grandmother    Colon polyps Maternal Grandmother        ?pre-cancerous   Colon cancer Neg Hx     Social History   Tobacco Use   Smoking status: Every Day    Types: E-cigarettes   Smokeless tobacco: Never  Vaping Use   Vaping Use: Every day  Substance Use Topics   Alcohol use: Yes    Comment: once a  month one drink   Drug use: Yes    Types: Marijuana    Review of Systems:   Review of Systems  Constitutional:  Negative for chills, fever, malaise/fatigue and weight loss.  HENT:  Negative for hearing loss, sinus pain and sore throat.   Respiratory:  Negative for cough and hemoptysis.   Cardiovascular:  Negative for chest pain, palpitations, leg swelling and PND.  Gastrointestinal:  Positive for constipation. Negative for abdominal pain, diarrhea, heartburn, nausea and vomiting.  Genitourinary:  Negative for dysuria, frequency and urgency.  Musculoskeletal:  Positive for joint pain (left ankle). Negative for back pain and neck pain.  Skin:  Negative for itching and rash.  Neurological:  Negative for dizziness, tingling, seizures and headaches.  Endo/Heme/Allergies:  Negative for polydipsia.  Psychiatric/Behavioral:  Positive for depression. The patient is nervous/anxious.     Objective:   BP 104/60 (BP Location: Left Arm, Patient Position: Sitting, Cuff Size: Normal)   Pulse 94   Temp (!) 97.5 F (36.4 C) (Temporal)   Ht 5\' 3"  (1.6 m)   Wt 92 lb 4 oz (41.8 kg)   LMP 09/09/2022 (Exact Date)   SpO2 97%   BMI 16.34 kg/m  Body mass index is 16.34 kg/m.   General Appearance:    Alert, cooperative, no distress, appears stated age  Head:    Normocephalic, without obvious abnormality, atraumatic  Eyes:    PERRL, conjunctiva/corneas clear, EOM's intact, fundi    benign, both eyes  Ears:    Normal TM's and external ear canals, both ears  Nose:   Nares normal, septum midline, mucosa normal, no drainage    or sinus tenderness  Throat:   Lips, mucosa, and tongue normal; teeth and gums normal  Neck:   Supple, symmetrical, trachea midline, no adenopathy;    thyroid:  no enlargement/tenderness/nodules; no carotid   bruit or JVD  Back:     Symmetric, no curvature, ROM normal, no CVA tenderness  Lungs:     Clear to auscultation bilaterally, respirations unlabored  Chest Wall:    No  tenderness or deformity   Heart:    Regular rate and rhythm, S1 and S2 normal, no murmur, rub or gallop  Breast Exam:    Deferred  Abdomen:     Soft, non-tender, bowel sounds active all four quadrants,    no masses, no organomegaly  Genitalia:    Deferred  Extremities:   Extremities normal, atraumatic, no cyanosis or edema Left ankle without decreased ROM, TTP or obvious abnormalities  Pulses:   2+ and symmetric all extremities  Skin:   Skin color, texture, turgor normal, no rashes or lesions  Lymph nodes:  Cervical, supraclavicular, and axillary nodes normal  Neurologic:   CNII-XII intact, normal strength, sensation and reflexes    throughout    Assessment/Plan:   Routine physical examination Today patient counseled on age appropriate routine health concerns for screening and prevention, each reviewed and up to date or declined. Immunizations reviewed and up to date or declined. Labs ordered and reviewed. Risk factors for depression reviewed and negative. Hearing function and visual acuity are intact. ADLs screened and addressed as needed. Functional ability and level of safety reviewed and appropriate. Education, counseling and referrals performed based on assessed risks today. Patient provided with a copy of personalized plan for preventive services.  Anxiety, generalized Well controlled Continue ativan 0.5 mg prn Follow-up in 6 mo, sooner if concerns  Screening for HIV (human immunodeficiency virus) Complete today  Mild single current episode of major depressive disorder ([facility]) Uncontrolled Denies SI/HI Increase Remeron to 30 mg daily I discussed with patient that if they develop any SI, to tell someone immediately and seek medical attention. Follow-up in 6 months, sooner if concerns  Underweight Continue efforts at healthy weight gain  Pap smear for cervical cancer screening Referral to gynecology  Encounter for screening for other viral diseases Update hep C  Chronic  pain of left ankle No red flags on exam She declines further workup at this time Consider referral to sports medicine  I,Alexis Herring,acting as a scribe for Jarold Motto, PA.,have documented all relevant documentation on the behalf of Jarold Motto, PA,as directed by  Jarold Motto, PA while in the presence of Jarold Motto, [place].  I, Jarold Motto, [place], have reviewed all documentation for this visit. The documentation on 09/10/22 for the exam, diagnosis, procedures, and orders are all accurate and complete.   Jarold Motto, PA-C [facility]

## 2022-09-11 LAB — COMPREHENSIVE METABOLIC PANEL
ALT: 8 U/L (ref 0–35)
AST: 18 U/L (ref 0–37)
Albumin: 4.7 g/dL (ref 3.5–5.2)
Alkaline Phosphatase: 42 U/L (ref 39–117)
BUN: 12 mg/dL (ref 6–23)
CO2: 27 mEq/L (ref 19–32)
Calcium: 10 mg/dL (ref 8.4–10.5)
Chloride: 101 mEq/L (ref 96–112)
Creatinine, Ser: 1.11 mg/dL (ref 0.40–1.20)
GFR: 70.05 mL/min (ref >60.00)
Glucose, Bld: 145 mg/dL — ABNORMAL HIGH (ref 70–99)
Potassium: 3.9 mEq/L (ref 3.5–5.1)
Sodium: 139 mEq/L (ref 135–145)
Total Bilirubin: 0.6 mg/dL (ref 0.2–1.2)
Total Protein: 7.4 g/dL (ref 6.0–8.3)

## 2022-09-11 LAB — CBC WITH DIFFERENTIAL/PLATELET
Basophils Absolute: 0.1 10*3/uL (ref 0.0–0.1)
Basophils Relative: 1 % (ref 0.0–3.0)
Eosinophils Absolute: 0.1 10*3/uL (ref 0.0–0.7)
Eosinophils Relative: 0.9 % (ref 0.0–5.0)
HCT: 37.1 % (ref 36.0–46.0)
Hemoglobin: 12.5 g/dL (ref 12.0–15.0)
Lymphocytes Relative: 23.1 % (ref 12.0–46.0)
Lymphs Abs: 2 10*3/uL (ref 0.7–4.0)
MCHC: 33.6 g/dL (ref 30.0–36.0)
MCV: 92.8 fl (ref 78.0–100.0)
Monocytes Absolute: 0.8 10*3/uL (ref 0.1–1.0)
Monocytes Relative: 9.1 % (ref 3.0–12.0)
Neutro Abs: 5.8 10*3/uL (ref 1.4–7.7)
Neutrophils Relative %: 65.9 % (ref 43.0–77.0)
Platelets: 203 10*3/uL (ref 150.0–400.0)
RBC: 4 Mil/uL (ref 3.87–5.11)
RDW: 12.5 % (ref 11.5–15.5)
WBC: 8.8 10*3/uL (ref 4.0–10.5)

## 2022-09-11 LAB — HIV ANTIBODY (ROUTINE TESTING W REFLEX): HIV 1&2 Ab, 4th Generation: NONREACTIVE

## 2022-09-11 LAB — HEPATITIS C ANTIBODY: Hepatitis C Ab: NONREACTIVE

## 2022-10-21 DIAGNOSIS — F410 Panic disorder [episodic paroxysmal anxiety] without agoraphobia: Secondary | ICD-10-CM | POA: Diagnosis not present

## 2022-10-21 DIAGNOSIS — F332 Major depressive disorder, recurrent severe without psychotic features: Secondary | ICD-10-CM | POA: Diagnosis not present

## 2022-10-21 DIAGNOSIS — F4311 Post-traumatic stress disorder, acute: Secondary | ICD-10-CM | POA: Diagnosis not present

## 2022-10-21 DIAGNOSIS — Z79899 Other long term (current) drug therapy: Secondary | ICD-10-CM | POA: Diagnosis not present

## 2022-10-27 DIAGNOSIS — F410 Panic disorder [episodic paroxysmal anxiety] without agoraphobia: Secondary | ICD-10-CM | POA: Diagnosis not present

## 2022-10-27 DIAGNOSIS — F332 Major depressive disorder, recurrent severe without psychotic features: Secondary | ICD-10-CM | POA: Diagnosis not present

## 2022-10-27 DIAGNOSIS — F4311 Post-traumatic stress disorder, acute: Secondary | ICD-10-CM | POA: Diagnosis not present

## 2022-11-04 DIAGNOSIS — F4311 Post-traumatic stress disorder, acute: Secondary | ICD-10-CM | POA: Diagnosis not present

## 2022-11-04 DIAGNOSIS — F410 Panic disorder [episodic paroxysmal anxiety] without agoraphobia: Secondary | ICD-10-CM | POA: Diagnosis not present

## 2022-11-04 DIAGNOSIS — F332 Major depressive disorder, recurrent severe without psychotic features: Secondary | ICD-10-CM | POA: Diagnosis not present

## 2022-11-20 DIAGNOSIS — F410 Panic disorder [episodic paroxysmal anxiety] without agoraphobia: Secondary | ICD-10-CM | POA: Diagnosis not present

## 2022-11-20 DIAGNOSIS — F4311 Post-traumatic stress disorder, acute: Secondary | ICD-10-CM | POA: Diagnosis not present

## 2022-11-20 DIAGNOSIS — F332 Major depressive disorder, recurrent severe without psychotic features: Secondary | ICD-10-CM | POA: Diagnosis not present

## 2022-11-27 DIAGNOSIS — F410 Panic disorder [episodic paroxysmal anxiety] without agoraphobia: Secondary | ICD-10-CM | POA: Diagnosis not present

## 2022-11-27 DIAGNOSIS — F332 Major depressive disorder, recurrent severe without psychotic features: Secondary | ICD-10-CM | POA: Diagnosis not present

## 2022-12-26 DIAGNOSIS — F410 Panic disorder [episodic paroxysmal anxiety] without agoraphobia: Secondary | ICD-10-CM | POA: Diagnosis not present

## 2022-12-26 DIAGNOSIS — F4311 Post-traumatic stress disorder, acute: Secondary | ICD-10-CM | POA: Diagnosis not present

## 2022-12-26 DIAGNOSIS — F332 Major depressive disorder, recurrent severe without psychotic features: Secondary | ICD-10-CM | POA: Diagnosis not present

## 2023-01-06 DIAGNOSIS — F332 Major depressive disorder, recurrent severe without psychotic features: Secondary | ICD-10-CM | POA: Diagnosis not present

## 2023-01-06 DIAGNOSIS — F4311 Post-traumatic stress disorder, acute: Secondary | ICD-10-CM | POA: Diagnosis not present

## 2023-01-28 DIAGNOSIS — F410 Panic disorder [episodic paroxysmal anxiety] without agoraphobia: Secondary | ICD-10-CM | POA: Diagnosis not present

## 2023-01-28 DIAGNOSIS — F4312 Post-traumatic stress disorder, chronic: Secondary | ICD-10-CM | POA: Diagnosis not present

## 2023-01-28 DIAGNOSIS — F332 Major depressive disorder, recurrent severe without psychotic features: Secondary | ICD-10-CM | POA: Diagnosis not present

## 2023-02-03 DIAGNOSIS — F332 Major depressive disorder, recurrent severe without psychotic features: Secondary | ICD-10-CM | POA: Diagnosis not present

## 2023-02-03 DIAGNOSIS — F410 Panic disorder [episodic paroxysmal anxiety] without agoraphobia: Secondary | ICD-10-CM | POA: Diagnosis not present

## 2023-02-03 DIAGNOSIS — F4312 Post-traumatic stress disorder, chronic: Secondary | ICD-10-CM | POA: Diagnosis not present

## 2023-02-11 DIAGNOSIS — F4312 Post-traumatic stress disorder, chronic: Secondary | ICD-10-CM | POA: Diagnosis not present

## 2023-02-11 DIAGNOSIS — F410 Panic disorder [episodic paroxysmal anxiety] without agoraphobia: Secondary | ICD-10-CM | POA: Diagnosis not present

## 2023-02-11 DIAGNOSIS — F332 Major depressive disorder, recurrent severe without psychotic features: Secondary | ICD-10-CM | POA: Diagnosis not present

## 2023-02-18 DIAGNOSIS — F332 Major depressive disorder, recurrent severe without psychotic features: Secondary | ICD-10-CM | POA: Diagnosis not present

## 2023-02-18 DIAGNOSIS — F410 Panic disorder [episodic paroxysmal anxiety] without agoraphobia: Secondary | ICD-10-CM | POA: Diagnosis not present

## 2023-02-18 DIAGNOSIS — F4312 Post-traumatic stress disorder, chronic: Secondary | ICD-10-CM | POA: Diagnosis not present

## 2023-02-25 DIAGNOSIS — F332 Major depressive disorder, recurrent severe without psychotic features: Secondary | ICD-10-CM | POA: Diagnosis not present

## 2023-02-25 DIAGNOSIS — F410 Panic disorder [episodic paroxysmal anxiety] without agoraphobia: Secondary | ICD-10-CM | POA: Diagnosis not present

## 2023-02-25 DIAGNOSIS — F4312 Post-traumatic stress disorder, chronic: Secondary | ICD-10-CM | POA: Diagnosis not present

## 2023-03-02 DIAGNOSIS — F332 Major depressive disorder, recurrent severe without psychotic features: Secondary | ICD-10-CM | POA: Diagnosis not present

## 2023-03-16 DIAGNOSIS — F410 Panic disorder [episodic paroxysmal anxiety] without agoraphobia: Secondary | ICD-10-CM | POA: Diagnosis not present

## 2023-03-16 DIAGNOSIS — F332 Major depressive disorder, recurrent severe without psychotic features: Secondary | ICD-10-CM | POA: Diagnosis not present

## 2023-03-16 DIAGNOSIS — F4312 Post-traumatic stress disorder, chronic: Secondary | ICD-10-CM | POA: Diagnosis not present

## 2023-03-25 DIAGNOSIS — F332 Major depressive disorder, recurrent severe without psychotic features: Secondary | ICD-10-CM | POA: Diagnosis not present

## 2023-03-25 DIAGNOSIS — F4312 Post-traumatic stress disorder, chronic: Secondary | ICD-10-CM | POA: Diagnosis not present

## 2023-03-25 DIAGNOSIS — F410 Panic disorder [episodic paroxysmal anxiety] without agoraphobia: Secondary | ICD-10-CM | POA: Diagnosis not present

## 2023-04-15 DIAGNOSIS — F4312 Post-traumatic stress disorder, chronic: Secondary | ICD-10-CM | POA: Diagnosis not present

## 2023-04-15 DIAGNOSIS — F330 Major depressive disorder, recurrent, mild: Secondary | ICD-10-CM | POA: Diagnosis not present

## 2023-04-15 DIAGNOSIS — F410 Panic disorder [episodic paroxysmal anxiety] without agoraphobia: Secondary | ICD-10-CM | POA: Diagnosis not present

## 2023-04-16 DIAGNOSIS — F4312 Post-traumatic stress disorder, chronic: Secondary | ICD-10-CM | POA: Diagnosis not present

## 2023-04-16 DIAGNOSIS — F330 Major depressive disorder, recurrent, mild: Secondary | ICD-10-CM | POA: Diagnosis not present

## 2023-04-16 DIAGNOSIS — F410 Panic disorder [episodic paroxysmal anxiety] without agoraphobia: Secondary | ICD-10-CM | POA: Diagnosis not present

## 2023-05-06 DIAGNOSIS — F410 Panic disorder [episodic paroxysmal anxiety] without agoraphobia: Secondary | ICD-10-CM | POA: Diagnosis not present

## 2023-05-06 DIAGNOSIS — F4312 Post-traumatic stress disorder, chronic: Secondary | ICD-10-CM | POA: Diagnosis not present

## 2023-05-06 DIAGNOSIS — F330 Major depressive disorder, recurrent, mild: Secondary | ICD-10-CM | POA: Diagnosis not present

## 2023-05-19 DIAGNOSIS — F4312 Post-traumatic stress disorder, chronic: Secondary | ICD-10-CM | POA: Diagnosis not present

## 2023-05-19 DIAGNOSIS — F410 Panic disorder [episodic paroxysmal anxiety] without agoraphobia: Secondary | ICD-10-CM | POA: Diagnosis not present

## 2023-05-19 DIAGNOSIS — F330 Major depressive disorder, recurrent, mild: Secondary | ICD-10-CM | POA: Diagnosis not present

## 2023-07-29 DIAGNOSIS — F330 Major depressive disorder, recurrent, mild: Secondary | ICD-10-CM | POA: Diagnosis not present

## 2023-07-29 DIAGNOSIS — F410 Panic disorder [episodic paroxysmal anxiety] without agoraphobia: Secondary | ICD-10-CM | POA: Diagnosis not present

## 2023-07-29 DIAGNOSIS — F4312 Post-traumatic stress disorder, chronic: Secondary | ICD-10-CM | POA: Diagnosis not present

## 2023-08-13 ENCOUNTER — Other Ambulatory Visit: Payer: Self-pay | Admitting: Physician Assistant

## 2023-08-13 DIAGNOSIS — F330 Major depressive disorder, recurrent, mild: Secondary | ICD-10-CM | POA: Diagnosis not present

## 2023-08-13 DIAGNOSIS — F410 Panic disorder [episodic paroxysmal anxiety] without agoraphobia: Secondary | ICD-10-CM | POA: Diagnosis not present

## 2023-10-01 DIAGNOSIS — F330 Major depressive disorder, recurrent, mild: Secondary | ICD-10-CM | POA: Diagnosis not present

## 2023-10-01 DIAGNOSIS — F410 Panic disorder [episodic paroxysmal anxiety] without agoraphobia: Secondary | ICD-10-CM | POA: Diagnosis not present

## 2023-10-07 DIAGNOSIS — F330 Major depressive disorder, recurrent, mild: Secondary | ICD-10-CM | POA: Diagnosis not present

## 2023-10-07 DIAGNOSIS — F410 Panic disorder [episodic paroxysmal anxiety] without agoraphobia: Secondary | ICD-10-CM | POA: Diagnosis not present

## 2023-11-02 ENCOUNTER — Other Ambulatory Visit: Payer: Self-pay | Admitting: Physician Assistant

## 2023-11-09 ENCOUNTER — Ambulatory Visit (INDEPENDENT_AMBULATORY_CARE_PROVIDER_SITE_OTHER): Payer: Medicaid Other | Admitting: Physician Assistant

## 2023-11-09 ENCOUNTER — Encounter: Payer: Self-pay | Admitting: Physician Assistant

## 2023-11-09 VITALS — BP 110/68 | HR 100 | Temp 98.1°F | Ht 63.0 in | Wt 99.2 lb

## 2023-11-09 DIAGNOSIS — R636 Underweight: Secondary | ICD-10-CM

## 2023-11-09 DIAGNOSIS — Z0000 Encounter for general adult medical examination without abnormal findings: Secondary | ICD-10-CM

## 2023-11-09 DIAGNOSIS — F411 Generalized anxiety disorder: Secondary | ICD-10-CM

## 2023-11-09 DIAGNOSIS — R7309 Other abnormal glucose: Secondary | ICD-10-CM | POA: Diagnosis not present

## 2023-11-09 DIAGNOSIS — F320 Major depressive disorder, single episode, mild: Secondary | ICD-10-CM

## 2023-11-09 MED ORDER — NORGESTIMATE-ETH ESTRADIOL 0.18/0.215/0.25 MG-25 MCG PO TABS: 1.0000 | ORAL_TABLET | Freq: Every day | ORAL | 3 refills | Status: AC

## 2023-11-09 NOTE — Patient Instructions (Signed)
 It was great to see you!  Please go to the lab for blood work.   Our office will call you with your results unless you have chosen to receive results via MyChart.  If your blood work is normal we will follow-up each year for physicals and as scheduled for chronic medical problems.  If anything is abnormal we will treat accordingly and get you in for a follow-up.  Take care,  Lelon Mast

## 2023-11-09 NOTE — Progress Notes (Signed)
Subjective:    Alice Armstrong is a 22 y.o. female and is here for a comprehensive physical exam.  HPI  Health Maintenance Due  Topic Date Due   Pneumococcal Vaccine 10-9 Years old (1 of 1 - PPSV23 or PCV20) 02/27/2005   CHLAMYDIA SCREENING  12/09/2016   Cervical Cancer Screening (Pap smear)  Never done    Acute Concerns: Headaches  She does complain of occasional headaches.  She reports being far-sided in one eye and near-sided in the other, she is prescribed a daily glasses but states he doesn't wear them often.   Chronic Issues: Anxiety/Depression Reports compliance and good tolerance of Remeron 15mg  nightly and Ativan 0.5 mg daily. This regimen started in last January/February.  She states her symptoms have greatly improved with this current regimen.  Does report occasionally feeling fatigued throughout the day and difficulty sleeping.  Patient attributes these symptoms to her anxiety and depression.  Reports occasional constipation that is managed by caffeine intake. No further GI complaints.  Has been following up with a therapist and a psychiatrist at [facility].   Contraceptives  Managed with Tri-Lo-Sprintec without any negative side effects.  No concerns or symptoms at this time.  Health Maintenance: Immunizations -- N/A Colonoscopy -- N/A Mammogram -- N/A PAP -- declines - currently on menses, encouraged to return Bone Density -- N/A Diet -- typical diet  Exercise -- walks several times a week.  Sleep habits -- no energy during the day, difficulty sleeping with irregular mood Mood -- stable   UTD with dentist? - no UTD with eye doctor? - yes  Weight history: Wt Readings from Last 10 Encounters:  11/09/23 99 lb 4 oz (45 kg)  09/10/22 92 lb 4 oz (41.8 kg)  02/25/22 95 lb (43.1 kg)  11/19/21 88 lb (39.9 kg)  08/19/21 88 lb (39.9 kg)  07/09/21 88 lb 6.1 oz (40.1 kg)  06/08/19 87 lb 4 oz (39.6 kg)  05/18/19 87 lb 6.1 oz (39.6 kg)  05/11/19  87 lb (39.5 kg)  05/10/19 85 lb (38.6 kg)   Body mass index is 17.58 kg/m. Patient's last menstrual period was 11/06/2023 (approximate).  Alcohol use:  reports current alcohol use.  Tobacco use:  Tobacco Use: High Risk (11/09/2023)   Patient History    Smoking Tobacco Use: Every Day    Smokeless Tobacco Use: Never    Passive Exposure: Not on file   Eligible for lung cancer screening? no     09/10/2022    1:56 PM  Depression screen PHQ 2/9  Decreased Interest 2  Down, Depressed, Hopeless 2  PHQ - 2 Score 4  Altered sleeping 1  Tired, decreased energy 1  Change in appetite 1  Feeling bad or failure about yourself  1  Trouble concentrating 3  Moving slowly or fidgety/restless 0  Suicidal thoughts 0  PHQ-9 Score 11  Difficult doing work/chores Very difficult     Other providers/specialists: Patient Care Team: Jarold Motto, [place] as PCP - General (Physician Assistant)    PMHx, SurgHx, SocialHx, Medications, and Allergies were reviewed in the Visit Navigator and updated as appropriate.   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   UTI (urinary tract infection)    can sometimes get  chronic UTIs for "months"     Past Surgical History:  Procedure Laterality Date   KIDNEY SURGERY  at 89 weeks old   KIDNEY SURGERY       Family History  Problem Relation Age of Onset   Hypertension Mother    Anxiety disorder Mother    Depression Mother    Thyroid disease Mother    Hypertension Father    Heart attack Father    Melanoma Maternal Grandmother    Colon polyps Maternal Grandmother        ?pre-cancerous   Colon cancer Neg Hx     Social History   Tobacco Use   Smoking status: Every Day    Types: E-cigarettes   Smokeless tobacco: Never  Vaping Use   Vaping status: Every  Day  Substance Use Topics   Alcohol use: Yes    Comment: once a month one drink   Drug use: Yes    Types: Marijuana    Review of Systems:   Review of Systems  Constitutional:  Negative for chills, fever, malaise/fatigue and weight loss.  HENT:  Negative for hearing loss, sinus pain and sore throat.   Respiratory:  Negative for cough and hemoptysis.   Cardiovascular:  Negative for chest pain, palpitations, leg swelling and PND.  Gastrointestinal:  Positive for constipation. Negative for abdominal pain, diarrhea, heartburn, nausea and vomiting.  Genitourinary:  Negative for dysuria, frequency and urgency.  Musculoskeletal:  Negative for back pain, myalgias and neck pain.  Skin:  Negative for itching and rash.  Neurological:  Positive for headaches. Negative for dizziness, tingling and seizures.  Endo/Heme/Allergies:  Negative for polydipsia.  Psychiatric/Behavioral:  Negative for depression. The patient is not nervous/anxious.     Objective:   BP 110/68 (BP Location: Left Arm, Patient Position: Sitting, Cuff Size: Normal)   Pulse 100   Temp 98.1 F (36.7 C) (Temporal)   Ht 5\' 3"  (1.6 m)   Wt 99 lb 4 oz (45 kg)   LMP 11/06/2023 (Approximate)   SpO2 96%   BMI 17.58 kg/m  Body mass index is 17.58 kg/m.   General Appearance:    Alert, cooperative, no distress, appears stated age  Head:    Normocephalic, without obvious abnormality, atraumatic  Eyes:    PERRL, conjunctiva/corneas clear, EOM's intact, fundi    benign, both eyes  Ears:    Normal TM's and external ear canals, both ears  Nose:   Nares normal, septum midline, mucosa normal, no drainage    or sinus tenderness  Throat:   Lips, mucosa, and tongue normal; teeth and gums normal  Neck:   Supple, symmetrical, trachea midline, no adenopathy;    thyroid:  no enlargement/tenderness/nodules; no carotid   bruit or JVD  Back:     Symmetric, no curvature, ROM normal, no CVA tenderness  Lungs:     Clear to auscultation  bilaterally, respirations unlabored  Chest Wall:    No tenderness or deformity   Heart:    Regular rate and rhythm, S1 and S2 normal, no murmur, rub or gallop  Breast Exam:    Deferred  Abdomen:     Soft, non-tender, bowel sounds active all four quadrants,    no masses, no organomegaly  Genitalia:    Deferred  Extremities:   Extremities normal, atraumatic, no cyanosis or edema  Pulses:   2+ and symmetric all extremities  Skin:   Skin color, texture, turgor normal, no rashes or lesions  Lymph nodes:   Cervical, supraclavicular,  and axillary nodes normal  Neurologic:   CNII-XII intact, normal strength, sensation and reflexes    throughout    Assessment/Plan:   Routine physical examination Today patient counseled on age appropriate routine health concerns for screening and prevention, each reviewed and up to date or declined. Immunizations reviewed and up to date or declined. Labs ordered and reviewed. Risk factors for depression reviewed and negative. Hearing function and visual acuity are intact. ADLs screened and addressed as needed. Functional ability and level of safety reviewed and appropriate. Education, counseling and referrals performed based on assessed risks today. Patient provided with a copy of personalized plan for preventive services.  Anxiety, generalized; Mild single current episode of major depressive disorder (HCC) Management per psychiatry  Denies major concerns I discussed with patient that if they develop any SI, to tell someone immediately and seek medical attention.  Underweight Weight has slightly improved Continue to monitor Follow-up closely if weight declines  Elevated glucose Update A1c and provide recommendations accordingly   Jarold Motto, PA-C [facility],acting as a Neurosurgeon for [employer], PA.,have documented all relevant documentation on the behalf of Jarold Motto, PA,as directed by  Jarold Motto, PA while  in the presence of Jarold Motto, [place].   I, Jarold Motto, [place], have reviewed all documentation for this visit. The documentation on 11/09/23 for the exam, diagnosis, procedures, and orders are all accurate and complete.

## 2023-11-10 LAB — COMPREHENSIVE METABOLIC PANEL
ALT: 11 U/L (ref 0–35)
AST: 25 U/L (ref 0–37)
Albumin: 4.5 g/dL (ref 3.5–5.2)
Alkaline Phosphatase: 37 U/L — ABNORMAL LOW (ref 39–117)
BUN: 11 mg/dL (ref 6–23)
CO2: 29 meq/L (ref 19–32)
Calcium: 9.5 mg/dL (ref 8.4–10.5)
Chloride: 102 meq/L (ref 96–112)
Creatinine, Ser: 1.09 mg/dL (ref 0.40–1.20)
GFR: 71.01 mL/min (ref >60.00)
Glucose, Bld: 87 mg/dL (ref 70–99)
Potassium: 3.9 meq/L (ref 3.5–5.1)
Sodium: 139 meq/L (ref 135–145)
Total Bilirubin: 0.5 mg/dL (ref 0.2–1.2)
Total Protein: 7.5 g/dL (ref 6.0–8.3)

## 2023-11-10 LAB — LIPID PANEL
Cholesterol: 201 mg/dL — ABNORMAL HIGH (ref 0–200)
HDL: 69.7 mg/dL (ref >39.00)
LDL Cholesterol: 96 mg/dL (ref 0–99)
NonHDL: 131.79
Total CHOL/HDL Ratio: 3
Triglycerides: 179 mg/dL — ABNORMAL HIGH (ref 0.0–149.0)
VLDL: 35.8 mg/dL (ref 0.0–40.0)

## 2023-11-10 LAB — CBC WITH DIFFERENTIAL/PLATELET
Basophils Absolute: 0.1 10*3/uL (ref 0.0–0.1)
Basophils Relative: 1 % (ref 0.0–3.0)
Eosinophils Absolute: 0.1 10*3/uL (ref 0.0–0.7)
Eosinophils Relative: 1.2 % (ref 0.0–5.0)
HCT: 36.3 % (ref 36.0–46.0)
Hemoglobin: 12.3 g/dL (ref 12.0–15.0)
Lymphocytes Relative: 26.4 % (ref 12.0–46.0)
Lymphs Abs: 2.2 10*3/uL (ref 0.7–4.0)
MCHC: 33.9 g/dL (ref 30.0–36.0)
MCV: 94.7 fL (ref 78.0–100.0)
Monocytes Absolute: 0.7 10*3/uL (ref 0.1–1.0)
Monocytes Relative: 8.4 % (ref 3.0–12.0)
Neutro Abs: 5.3 10*3/uL (ref 1.4–7.7)
Neutrophils Relative %: 63 % (ref 43.0–77.0)
Platelets: 236 10*3/uL (ref 150.0–400.0)
RBC: 3.84 Mil/uL — ABNORMAL LOW (ref 3.87–5.11)
RDW: 12.7 % (ref 11.5–15.5)
WBC: 8.5 10*3/uL (ref 4.0–10.5)

## 2023-11-10 LAB — HEMOGLOBIN A1C: Hgb A1c MFr Bld: 5.2 % (ref 4.6–6.5)

## 2023-11-11 ENCOUNTER — Encounter: Payer: Self-pay | Admitting: Physician Assistant

## 2023-12-01 DIAGNOSIS — F410 Panic disorder [episodic paroxysmal anxiety] without agoraphobia: Secondary | ICD-10-CM | POA: Diagnosis not present

## 2023-12-01 DIAGNOSIS — F330 Major depressive disorder, recurrent, mild: Secondary | ICD-10-CM | POA: Diagnosis not present

## 2023-12-11 DIAGNOSIS — F3341 Major depressive disorder, recurrent, in partial remission: Secondary | ICD-10-CM | POA: Diagnosis not present

## 2023-12-11 DIAGNOSIS — F410 Panic disorder [episodic paroxysmal anxiety] without agoraphobia: Secondary | ICD-10-CM | POA: Diagnosis not present

## 2023-12-29 DIAGNOSIS — F3341 Major depressive disorder, recurrent, in partial remission: Secondary | ICD-10-CM | POA: Diagnosis not present

## 2023-12-29 DIAGNOSIS — F410 Panic disorder [episodic paroxysmal anxiety] without agoraphobia: Secondary | ICD-10-CM | POA: Diagnosis not present

## 2024-01-26 DIAGNOSIS — F3341 Major depressive disorder, recurrent, in partial remission: Secondary | ICD-10-CM | POA: Diagnosis not present

## 2024-01-26 DIAGNOSIS — F410 Panic disorder [episodic paroxysmal anxiety] without agoraphobia: Secondary | ICD-10-CM | POA: Diagnosis not present

## 2024-02-17 DIAGNOSIS — Z3200 Encounter for pregnancy test, result unknown: Secondary | ICD-10-CM | POA: Diagnosis not present

## 2024-02-17 DIAGNOSIS — R059 Cough, unspecified: Secondary | ICD-10-CM | POA: Diagnosis not present

## 2024-03-08 DIAGNOSIS — F3341 Major depressive disorder, recurrent, in partial remission: Secondary | ICD-10-CM | POA: Diagnosis not present

## 2024-03-08 DIAGNOSIS — F410 Panic disorder [episodic paroxysmal anxiety] without agoraphobia: Secondary | ICD-10-CM | POA: Diagnosis not present

## 2024-03-23 DIAGNOSIS — F3341 Major depressive disorder, recurrent, in partial remission: Secondary | ICD-10-CM | POA: Diagnosis not present

## 2024-03-23 DIAGNOSIS — F410 Panic disorder [episodic paroxysmal anxiety] without agoraphobia: Secondary | ICD-10-CM | POA: Diagnosis not present

## 2024-04-26 DIAGNOSIS — F3341 Major depressive disorder, recurrent, in partial remission: Secondary | ICD-10-CM | POA: Diagnosis not present

## 2024-04-26 DIAGNOSIS — F410 Panic disorder [episodic paroxysmal anxiety] without agoraphobia: Secondary | ICD-10-CM | POA: Diagnosis not present

## 2024-05-10 DIAGNOSIS — F3341 Major depressive disorder, recurrent, in partial remission: Secondary | ICD-10-CM | POA: Diagnosis not present

## 2024-05-10 DIAGNOSIS — F410 Panic disorder [episodic paroxysmal anxiety] without agoraphobia: Secondary | ICD-10-CM | POA: Diagnosis not present

## 2024-05-24 DIAGNOSIS — F3341 Major depressive disorder, recurrent, in partial remission: Secondary | ICD-10-CM | POA: Diagnosis not present

## 2024-05-24 DIAGNOSIS — F410 Panic disorder [episodic paroxysmal anxiety] without agoraphobia: Secondary | ICD-10-CM | POA: Diagnosis not present

## 2024-06-07 DIAGNOSIS — F410 Panic disorder [episodic paroxysmal anxiety] without agoraphobia: Secondary | ICD-10-CM | POA: Diagnosis not present

## 2024-06-07 DIAGNOSIS — F3341 Major depressive disorder, recurrent, in partial remission: Secondary | ICD-10-CM | POA: Diagnosis not present

## 2024-06-08 DIAGNOSIS — F410 Panic disorder [episodic paroxysmal anxiety] without agoraphobia: Secondary | ICD-10-CM | POA: Diagnosis not present

## 2024-06-08 DIAGNOSIS — F3341 Major depressive disorder, recurrent, in partial remission: Secondary | ICD-10-CM | POA: Diagnosis not present

## 2024-06-08 DIAGNOSIS — F332 Major depressive disorder, recurrent severe without psychotic features: Secondary | ICD-10-CM | POA: Diagnosis not present

## 2024-06-08 DIAGNOSIS — F4312 Post-traumatic stress disorder, chronic: Secondary | ICD-10-CM | POA: Diagnosis not present

## 2024-06-08 DIAGNOSIS — Z5181 Encounter for therapeutic drug level monitoring: Secondary | ICD-10-CM | POA: Diagnosis not present

## 2024-06-15 DIAGNOSIS — F4312 Post-traumatic stress disorder, chronic: Secondary | ICD-10-CM | POA: Diagnosis not present

## 2024-06-22 DIAGNOSIS — F4312 Post-traumatic stress disorder, chronic: Secondary | ICD-10-CM | POA: Diagnosis not present

## 2024-07-13 DIAGNOSIS — F4312 Post-traumatic stress disorder, chronic: Secondary | ICD-10-CM | POA: Diagnosis not present

## 2024-07-20 DIAGNOSIS — F4312 Post-traumatic stress disorder, chronic: Secondary | ICD-10-CM | POA: Diagnosis not present

## 2024-08-10 DIAGNOSIS — F4312 Post-traumatic stress disorder, chronic: Secondary | ICD-10-CM | POA: Diagnosis not present

## 2024-09-01 DIAGNOSIS — F4312 Post-traumatic stress disorder, chronic: Secondary | ICD-10-CM | POA: Diagnosis not present

## 2024-09-07 DIAGNOSIS — F332 Major depressive disorder, recurrent severe without psychotic features: Secondary | ICD-10-CM | POA: Diagnosis not present

## 2024-11-28 ENCOUNTER — Encounter: Admitting: Physician Assistant
